# Patient Record
Sex: Female | Born: 1980 | Race: White | Hispanic: Yes | Marital: Married | Smoking: Never smoker
Health system: Southern US, Community
[De-identification: ages and names within clinical notes are randomized; demographics above are authoritative.]

## PROBLEM LIST (undated history)

## (undated) ENCOUNTER — Emergency Department (HOSPITAL_COMMUNITY): Payer: MEDICAID

## (undated) DIAGNOSIS — Z789 Other specified health status: Secondary | ICD-10-CM

## (undated) HISTORY — DX: Other specified health status: Z78.9

## (undated) HISTORY — PX: NO PAST SURGERIES: SHX2092

---

## 2002-01-09 ENCOUNTER — Inpatient Hospital Stay (HOSPITAL_COMMUNITY): Admission: AD | Admit: 2002-01-09 | Discharge: 2002-01-11 | Payer: Self-pay | Admitting: *Deleted

## 2002-01-09 ENCOUNTER — Encounter (INDEPENDENT_AMBULATORY_CARE_PROVIDER_SITE_OTHER): Payer: Self-pay | Admitting: Specialist

## 2002-03-18 ENCOUNTER — Encounter: Admission: RE | Admit: 2002-03-18 | Discharge: 2002-03-18 | Payer: Self-pay | Admitting: Obstetrics and Gynecology

## 2006-01-06 ENCOUNTER — Inpatient Hospital Stay (HOSPITAL_COMMUNITY): Admission: AD | Admit: 2006-01-06 | Discharge: 2006-01-08 | Payer: Self-pay | Admitting: Obstetrics

## 2009-12-25 ENCOUNTER — Inpatient Hospital Stay (HOSPITAL_COMMUNITY): Admission: AD | Admit: 2009-12-25 | Discharge: 2009-12-27 | Payer: Self-pay | Admitting: Obstetrics

## 2011-01-16 ENCOUNTER — Ambulatory Visit (HOSPITAL_COMMUNITY)
Admission: RE | Admit: 2011-01-16 | Discharge: 2011-01-16 | Disposition: A | Discharge status: Home / Self Care | Payer: Self-pay | Source: Ambulatory Visit | Attending: Obstetrics | Admitting: Obstetrics

## 2011-01-16 ENCOUNTER — Other Ambulatory Visit (HOSPITAL_COMMUNITY): Payer: Self-pay | Admitting: Obstetrics

## 2011-01-16 ENCOUNTER — Encounter (HOSPITAL_COMMUNITY): Payer: Self-pay

## 2011-01-16 DIAGNOSIS — Z3689 Encounter for other specified antenatal screening: Secondary | ICD-10-CM | POA: Insufficient documentation

## 2011-01-16 DIAGNOSIS — IMO0002 Reserved for concepts with insufficient information to code with codable children: Secondary | ICD-10-CM

## 2011-01-16 DIAGNOSIS — O368390 Maternal care for abnormalities of the fetal heart rate or rhythm, unspecified trimester, not applicable or unspecified: Secondary | ICD-10-CM | POA: Insufficient documentation

## 2011-03-02 LAB — CBC
Hemoglobin: 12.6 g/dL (ref 12.0–15.0)
MCHC: 33 g/dL (ref 30.0–36.0)
MCV: 87.1 fL (ref 78.0–100.0)
MCV: 87.3 fL (ref 78.0–100.0)
Platelets: 187 10*3/uL (ref 150–400)
RBC: 3.89 MIL/uL (ref 3.87–5.11)
RBC: 4.39 MIL/uL (ref 3.87–5.11)
WBC: 10.4 10*3/uL (ref 4.0–10.5)
WBC: 14.6 10*3/uL — ABNORMAL HIGH (ref 4.0–10.5)

## 2011-03-02 LAB — RPR: RPR Ser Ql: NONREACTIVE

## 2011-05-09 ENCOUNTER — Inpatient Hospital Stay (HOSPITAL_COMMUNITY)
Admission: AD | Admit: 2011-05-09 | Discharge: 2011-05-12 | DRG: 775 | Disposition: A | Discharge status: Home / Self Care | Payer: Medicaid Other | Source: Ambulatory Visit | Attending: Obstetrics | Admitting: Obstetrics

## 2011-05-10 ENCOUNTER — Other Ambulatory Visit: Payer: Self-pay | Admitting: Obstetrics & Gynecology

## 2011-05-11 LAB — CBC
HCT: 36.1 % (ref 36.0–46.0)
Hemoglobin: 11.6 g/dL — ABNORMAL LOW (ref 12.0–15.0)
MCV: 87.6 fL (ref 78.0–100.0)
RBC: 4.12 MIL/uL (ref 3.87–5.11)
RDW: 14.8 % (ref 11.5–15.5)
WBC: 11.1 10*3/uL — ABNORMAL HIGH (ref 4.0–10.5)

## 2012-03-21 IMAGING — US US OB LIMITED
1 series · 14 of 14 positions shown · non-contrast
Comparison: none

[Series 1: us ob limited · 14 of 14 slices shown]
[im 1/14]
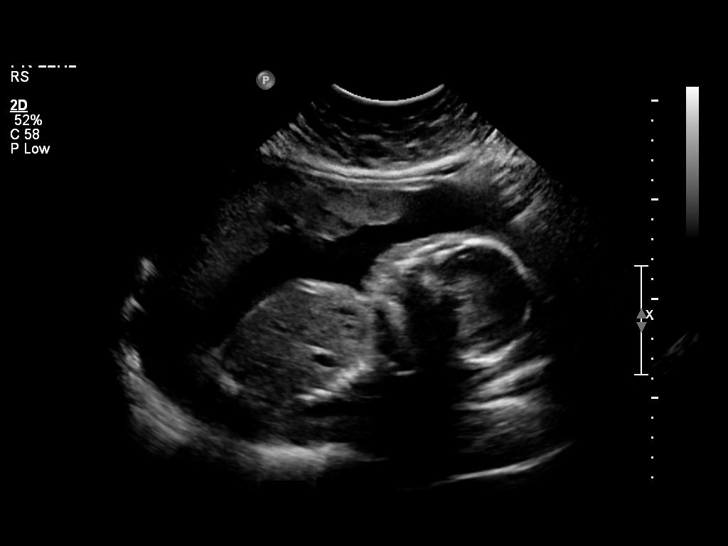
[im 2/14]
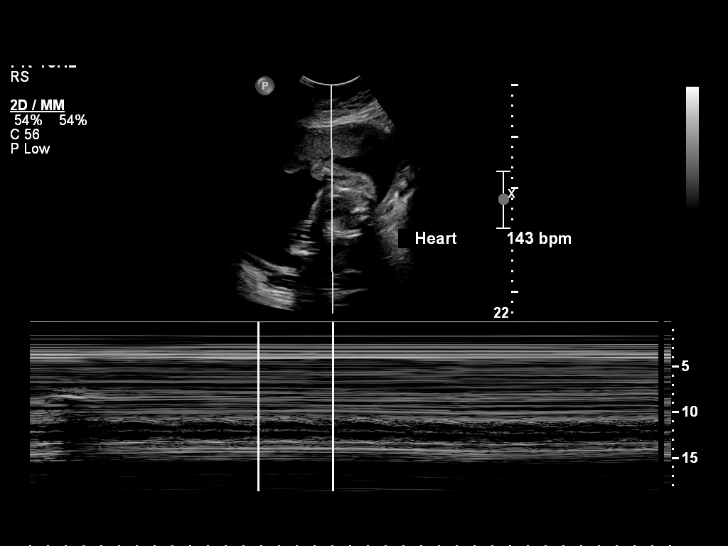
[im 3/14]
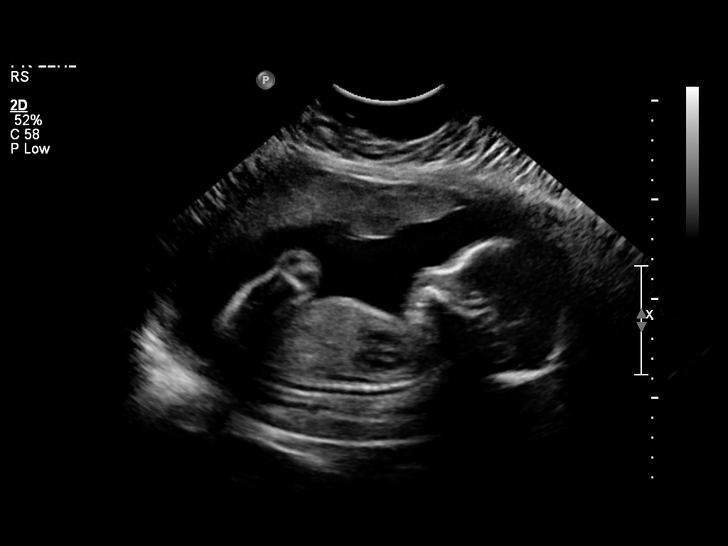
[im 4/14]
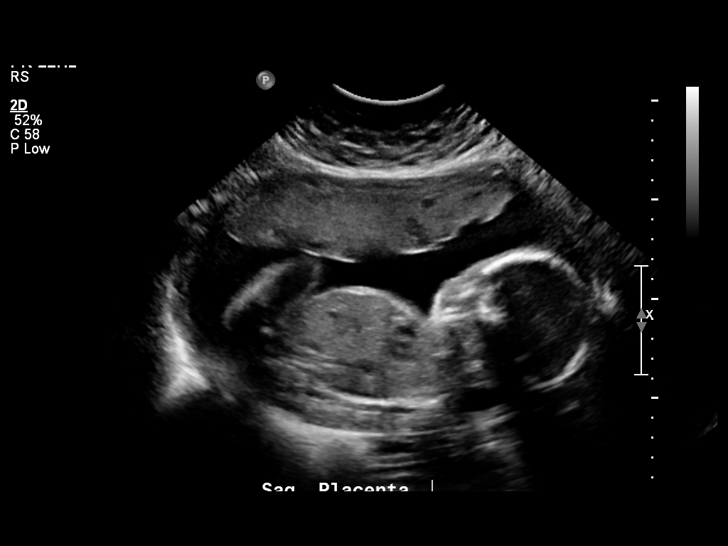
[im 5/14]
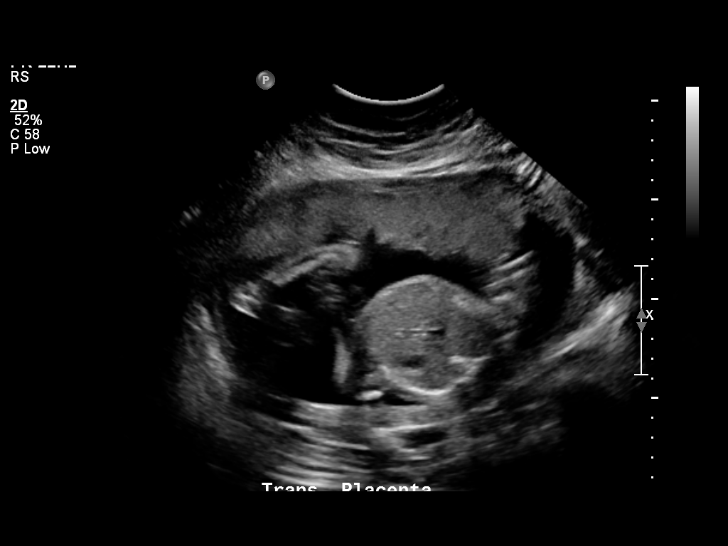
[im 6/14]
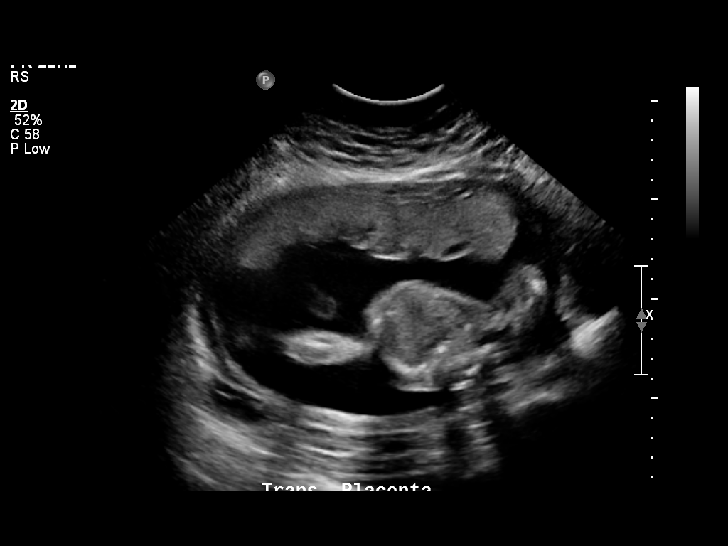
[im 7/14]
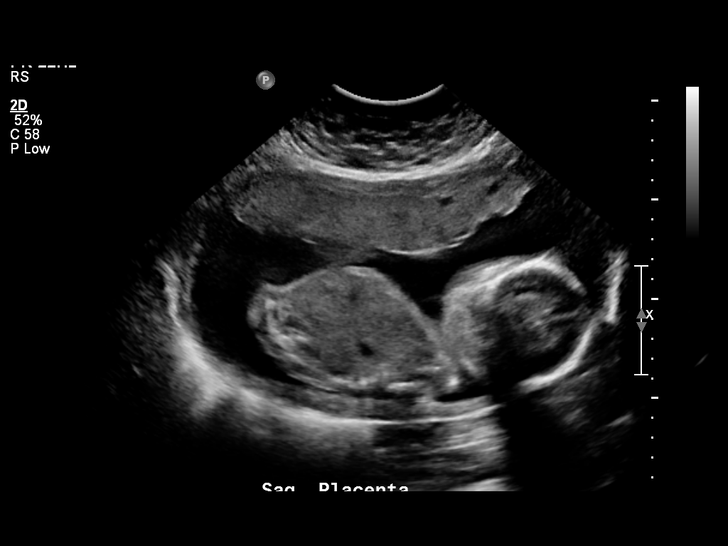
[im 8/14]
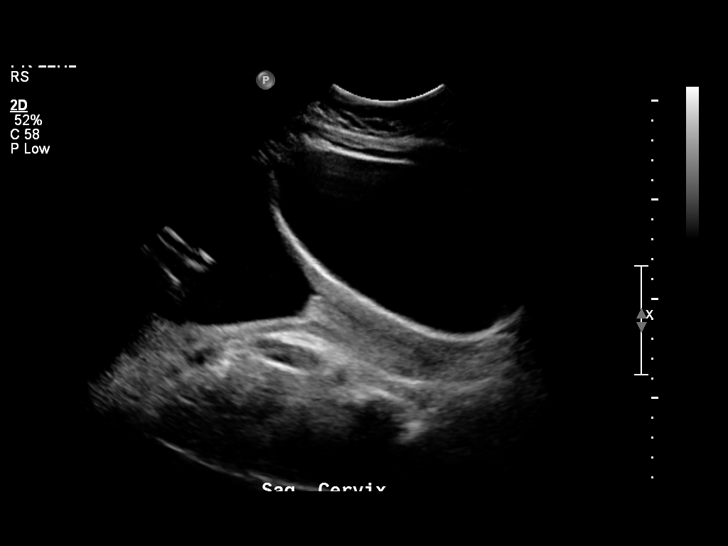
[im 9/14]
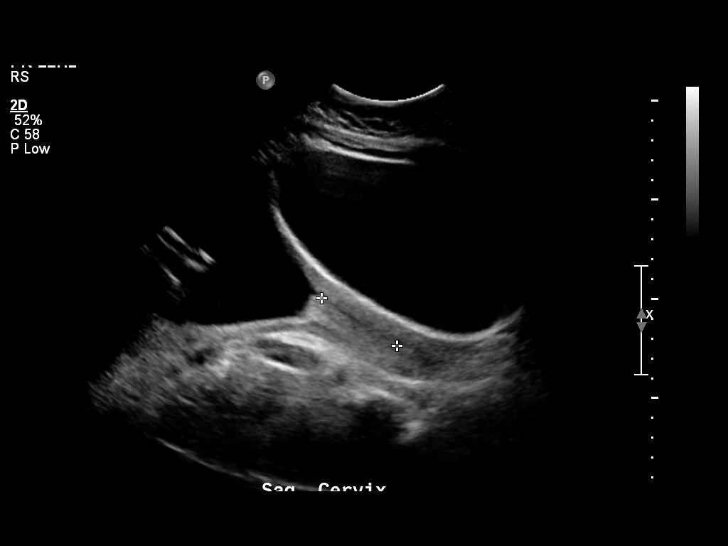
[im 10/14]
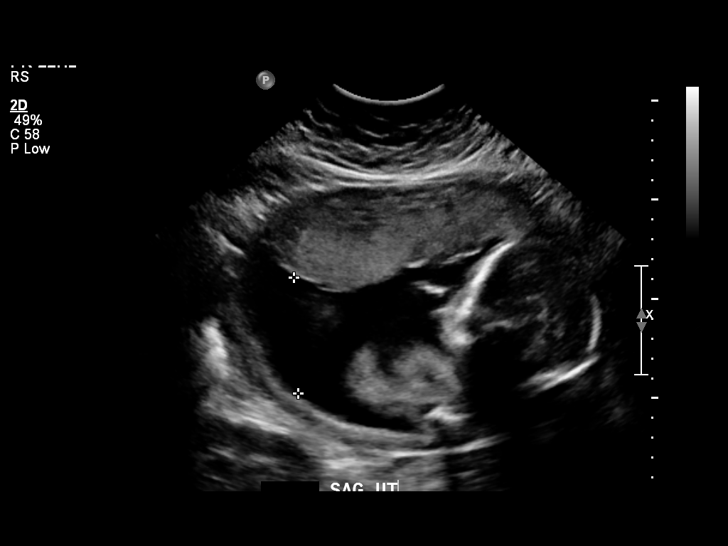
[im 11/14]
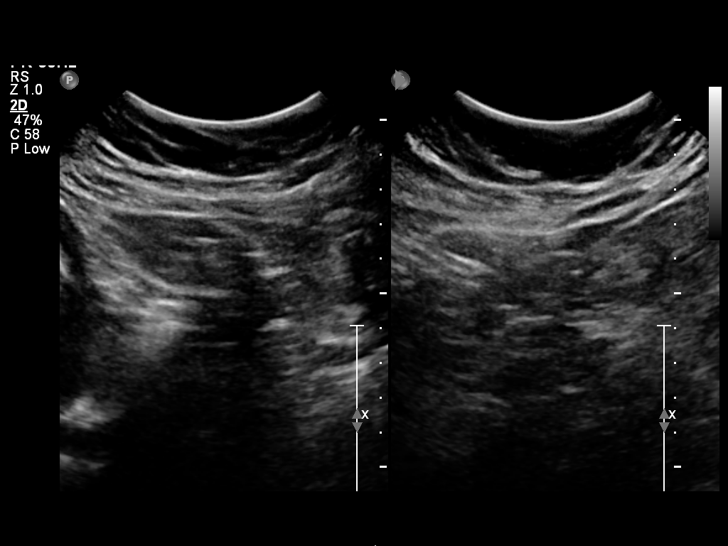
[im 12/14]
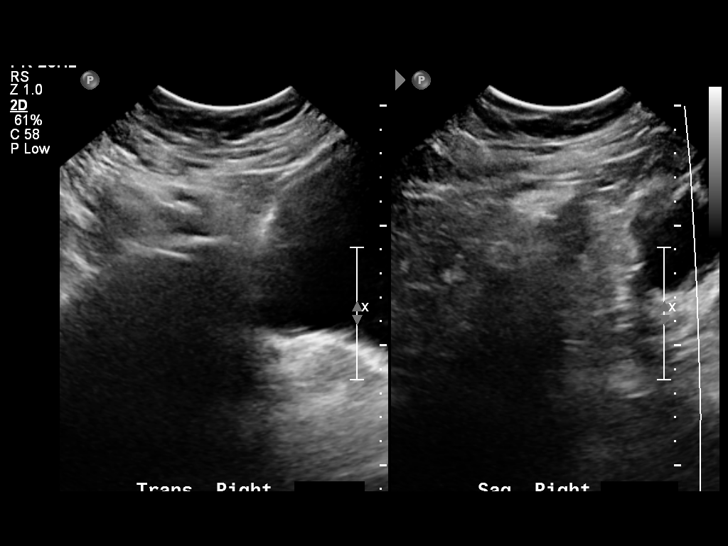
[im 13/14]
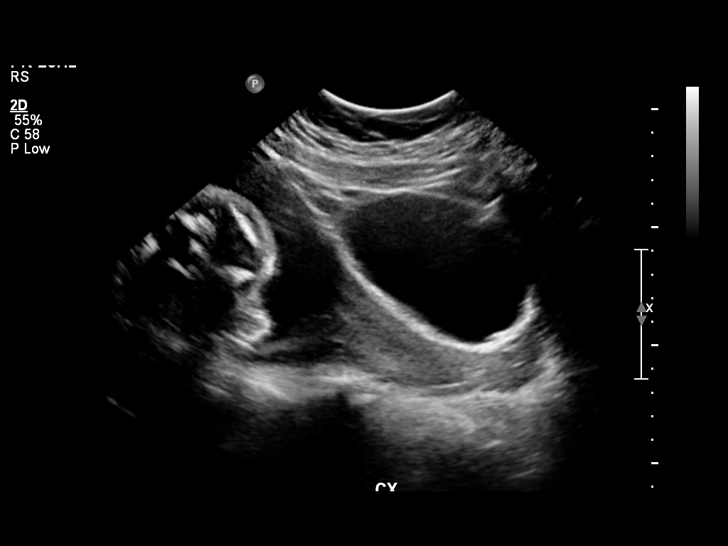
[im 14/14]
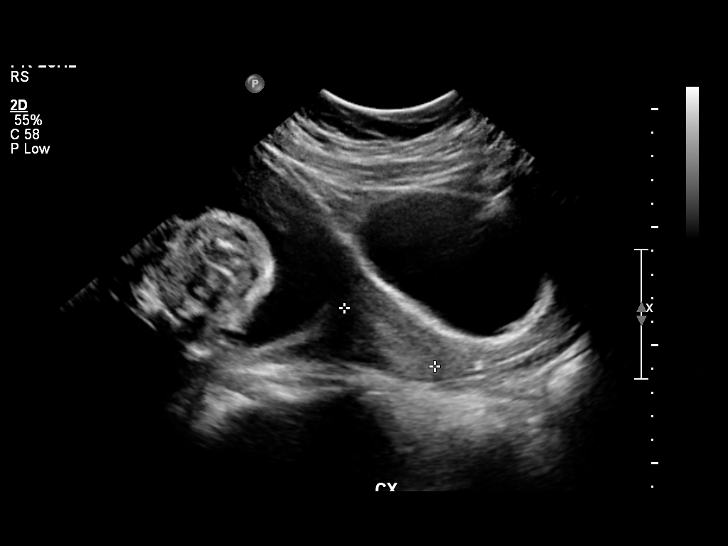

[14 of 14 positions shown; findings below may reference images not displayed]

OBSTETRICS REPORT
                      (Signed Final 01/16/2011 [DATE])

 Order#:         [id number]
Procedures

 [HOSPITAL]                                         76815.0
Indications

 No fetal cardiac activity detected;  assess viability
Fetal Evaluation

 Fetal Heart Rate:  143                          bpm
 Cardiac Activity:  Observed
 Presentation:      Cephalic
 Placenta:          Anterior, above cervical os
 P. Cord            Not well visualized
 Insertion:

 Amniotic Fluid
 AFI FV:      Subjectively within normal limits
                                             Larg Pckt:     5.8  cm
Cervix Uterus Adnexa

 Cervical Length:    4.47     cm

 Cervix:       Closed.

 Adnexa:     No abnormality visualized.
Impression

 Fetal cardiac activity confirmed with a rate of 143 bpm.

 Subjectively and quantitatively normal amniotic fluid volume.

 Normal cervical length and appearance, evaluated
 transabdominally.

 questions or concerns.

## 2014-10-16 ENCOUNTER — Encounter (HOSPITAL_COMMUNITY): Payer: Self-pay

## 2018-07-09 ENCOUNTER — Ambulatory Visit: Payer: Self-pay | Admitting: Emergency Medicine

## 2018-12-03 ENCOUNTER — Encounter (HOSPITAL_COMMUNITY): Payer: Self-pay | Admitting: Emergency Medicine

## 2018-12-03 ENCOUNTER — Ambulatory Visit (HOSPITAL_COMMUNITY)
Admission: EM | Admit: 2018-12-03 | Discharge: 2018-12-03 | Disposition: A | Discharge status: Home / Self Care | Payer: Self-pay | Attending: Internal Medicine | Admitting: Internal Medicine

## 2018-12-03 ENCOUNTER — Ambulatory Visit (HOSPITAL_COMMUNITY): Payer: Self-pay

## 2018-12-03 DIAGNOSIS — R1012 Left upper quadrant pain: Secondary | ICD-10-CM

## 2018-12-03 DIAGNOSIS — K5900 Constipation, unspecified: Secondary | ICD-10-CM

## 2018-12-03 DIAGNOSIS — K5901 Slow transit constipation: Secondary | ICD-10-CM

## 2018-12-03 DIAGNOSIS — R109 Unspecified abdominal pain: Secondary | ICD-10-CM

## 2018-12-03 DIAGNOSIS — Z975 Presence of (intrauterine) contraceptive device: Secondary | ICD-10-CM

## 2018-12-03 LAB — POCT I-STAT, CHEM 8
BUN: 11 mg/dL (ref 6–20)
CALCIUM ION: 1.18 mmol/L (ref 1.15–1.40)
CHLORIDE: 102 mmol/L (ref 98–111)
Creatinine, Ser: 0.6 mg/dL (ref 0.44–1.00)
GLUCOSE: 101 mg/dL — AB (ref 70–99)
HCT: 45 % (ref 36.0–46.0)
Hemoglobin: 15.3 g/dL — ABNORMAL HIGH (ref 12.0–15.0)
POTASSIUM: 4.5 mmol/L (ref 3.5–5.1)
Sodium: 139 mmol/L (ref 135–145)
TCO2: 31 mmol/L (ref 22–32)

## 2018-12-03 LAB — POCT PREGNANCY, URINE: PREG TEST UR: NEGATIVE

## 2018-12-03 MED ORDER — DICYCLOMINE HCL 20 MG PO TABS: 20.0000 mg | ORAL_TABLET | Freq: Two times a day (BID) | ORAL | 0 refills | Status: DC | PRN

## 2018-12-03 MED ORDER — POLYETHYLENE GLYCOL 3350 17 GM/SCOOP PO POWD: 17.0000 g | Freq: Every day | ORAL | 0 refills | Status: DC

## 2018-12-03 NOTE — Discharge Instructions (Signed)
Recommend start Miralax- dissolve powder in a liquid once a day. May take Bentyl 1 tablet every 12 hours as needed for stomach pain. Continue to increase fluids in diet. Follow-up with your PCP (listed above) as soon as possible for recheck or go to the Emergency Room if pain does not improve.

## 2018-12-03 NOTE — ED Provider Notes (Signed)
[place]    CSN: [id number] Arrival date & time: 12/03/18  2585     History   Chief Complaint Chief Complaint  Patient presents with  . Abdominal Pain    HPI Regina Cunningham is a 37 y.o. female.   37 year old female presents with left upper abdominal pain that radiates up towards her chest for the past 4 to 5 months. Is intermittent but 5 days ago had severe pain which caused vomiting. Also having occasional chills but denies any distinct fever, URI symptoms, cough, lower abdominal pain, dysuria, unusual vaginal discharge or back pain. She also denies any nausea or diarrhea. She has regular bowel movements almost daily and had a small bowel movement this morning. Eating food or drinking does not seem to affect the pain. Has tried taking Ibuprofen with minimal relief. She feels like there is a "knot" in that area and uncertain of etiology. No other chronic health issues. Takes no daily medication.   The history is provided by the patient. The history is limited by a language barrier. A language interpreter was used (Spanish video interpreter used).    History reviewed. No pertinent past medical history.  There are no active problems to display for this patient.   History reviewed. No pertinent surgical history.  OB History    Gravida  1   Para      Term      Preterm      AB      Living        SAB      TAB      Ectopic      Multiple      Live Births               Home Medications    Prior to Admission medications   Medication Sig Start Date End Date Taking? Authorizing Provider  levonorgestrel (MIRENA) 20 MCG/24HR IUD 1 each by Intrauterine route once.   Yes [provider]  dicyclomine (BENTYL) 20 MG tablet Take 1 tablet (20 mg total) by mouth 2 (two) times daily as needed (stomach pain and spasms). 12/03/18   Katy Apo, NP  polyethylene glycol powder (GLYCOLAX/MIRALAX) powder Take 17 g by mouth daily. 12/03/18   Katy Apo, NP    Family History History reviewed. No pertinent family history.  Social History Social History   Tobacco Use  . Smoking status: Never Smoker  . Smokeless tobacco: Never Used  Substance Use Topics  . Alcohol use: Never    Frequency: Never  . Drug use: Never     Allergies   Patient has no known allergies.   Review of Systems Review of Systems  Constitutional: Positive for chills. Negative for activity change, appetite change, diaphoresis, fatigue and fever.  HENT: Negative for congestion, facial swelling, sore throat and trouble swallowing.   Respiratory: Negative for cough, chest tightness, shortness of breath and wheezing.   Cardiovascular: Negative for chest pain and palpitations.  Gastrointestinal: Positive for abdominal pain and vomiting. Negative for blood in stool, constipation, diarrhea and nausea.  Genitourinary: Negative for decreased urine volume, difficulty urinating, dysuria, flank pain, frequency, hematuria, pelvic pain, vaginal bleeding and vaginal discharge.  Musculoskeletal: Negative for arthralgias, back pain and myalgias.  Skin: Negative for color change, rash and wound.  Neurological: Negative for dizziness, tremors, seizures, syncope, weakness, light-headedness, numbness and headaches.  Hematological: Negative for adenopathy. Does not bruise/bleed easily.     Physical Exam Triage Vital  Signs ED Triage Vitals [12/03/18 1022]  Enc Vitals Group     BP 138/81     Pulse Rate 74     Resp 18     Temp 98 F (36.7 C)     Temp Source Oral     SpO2 98 %     Weight      Height      Head Circumference      Peak Flow      Pain Score 6     Pain Loc      Pain Edu?      Excl. in [place]?    No data found.  Updated Vital Signs BP 138/81 (BP Location: Left Arm)   Pulse 74   Temp 98 F (36.7 C) (Oral)   Resp 18   SpO2 98%   Breastfeeding Unknown Comment: tested negative for pregnancy  Visual Acuity Right Eye Distance:   Left Eye Distance:     Bilateral Distance:    Right Eye Near:   Left Eye Near:    Bilateral Near:     Physical Exam Vitals signs and nursing note reviewed.  Constitutional:      General: She is not in acute distress.    Appearance: Normal appearance. She is well-developed, well-groomed and overweight. She is not ill-appearing.     Comments: Patient sitting comfortably in exam chair in no acute distress. Patient denies pain currently.   HENT:     Head: Normocephalic and atraumatic.     Right Ear: Hearing, tympanic membrane, ear canal and external ear normal.     Left Ear: Hearing, tympanic membrane, ear canal and external ear normal.     Nose: Nose normal.     Mouth/Throat:     Lips: Pink.     Mouth: Mucous membranes are moist.     Pharynx: Oropharynx is clear.  Eyes:     Extraocular Movements: Extraocular movements intact.     Conjunctiva/sclera: Conjunctivae normal.  Neck:     Musculoskeletal: Normal range of motion.  Cardiovascular:     Rate and Rhythm: Normal rate and regular rhythm.     Heart sounds: Normal heart sounds. No murmur.  Pulmonary:     Effort: Pulmonary effort is normal. No respiratory distress.     Breath sounds: Normal breath sounds and air entry. No decreased breath sounds, wheezing, rhonchi or rales.  Abdominal:     General: Bowel sounds are normal. There is no distension.     Palpations: Abdomen is soft. There is no hepatomegaly, splenomegaly, mass or pulsatile mass.     Tenderness: There is abdominal tenderness in the left upper quadrant. There is no right CVA tenderness, left CVA tenderness, guarding or rebound.       Comments: Tender on left upper quadrant area just below ribcage. No distinct mass detected. No splenomegaly.   Musculoskeletal: Normal range of motion.  Skin:    General: Skin is warm and dry.     Capillary Refill: Capillary refill takes less than 2 seconds.     Coloration: Skin is not jaundiced.     Findings: No erythema or rash.  Neurological:      Mental Status: She is alert and oriented to person, place, and time.     Sensory: Sensation is intact.     Motor: Motor function is intact.  Psychiatric:        Mood and Affect: Mood normal.        Behavior: Behavior normal. Behavior is  cooperative.      UC Treatments / Results  Labs (all labs ordered are listed, but only abnormal results are displayed) Labs Reviewed  POCT I-STAT, CHEM 8 - Abnormal; Notable for the following components:      Result Value   Glucose, Bld 101 (*)    Hemoglobin 15.3 (*)    All other components within normal limits  POC URINE PREG, ED  I-STAT CHEM 8, ED  POCT PREGNANCY, URINE    EKG None  Radiology Dg Abd 1 View  Result Date: 12/03/2018 CLINICAL DATA:  Upper abdominal pain, primarily left-sided EXAM: ABDOMEN - 1 VIEW COMPARISON:  None. FINDINGS: There is moderate stool in the colon. There is no bowel dilatation or air-fluid level to suggest bowel obstruction. No free air. Intrauterine device positioned in mid pelvis. IMPRESSION: No bowel obstruction or free air. Moderate stool in colon. Intrauterine device in mid pelvis. Electronically Signed   By: Lowella Grip III M.D.   On: 12/03/2018 11:21    Procedures Procedures (including critical care time)  Medications Ordered in UC Medications - No data to display  Initial Impression / Assessment and Plan / UC Course  I have reviewed the triage vital signs and the nursing notes.  Pertinent labs & imaging results that were available during my care of the patient were reviewed by me and considered in my medical decision making (see chart for details).    Reviewed negative urine pregnancy test with patient. Reviewed I-stat results which are essentially negative- just slightly elevated glucose. Discussed abdominal x-ray findings showing moderate amount of stool in colon. Discussed that she may have some slow transit constipation that could be causing her pain. Recommend trial Miralax once daily. May  also take Bentyl every 12 hours as needed for pain or spasms. Increase fluids in diet. Discussed that she may need additional testing and/or imaging to determine cause of pain. She will need to see a PCP for further work-up or go to the ER if pain becomes more severe. Patient verbalizes understanding and will make an appointment with a PCP ASAP for recheck.  Final Clinical Impressions(s) / UC Diagnoses   Final diagnoses:  Abdominal pain, left upper quadrant  Slow transit constipation     Discharge Instructions     Recommend start Miralax- dissolve powder in a liquid once a day. May take Bentyl 1 tablet every 12 hours as needed for stomach pain. Continue to increase fluids in diet. Follow-up with your PCP (listed above) as soon as possible for recheck or go to the Emergency Room if pain does not improve.     ED Prescriptions    Medication Sig Dispense Auth. Provider   polyethylene glycol powder (GLYCOLAX/MIRALAX) powder Take 17 g by mouth daily. 225 g Katy Apo, NP   dicyclomine (BENTYL) 20 MG tablet Take 1 tablet (20 mg total) by mouth 2 (two) times daily as needed (stomach pain and spasms). 20 tablet Katy Apo, NP     Controlled Substance Prescriptions [place] Controlled Substance Registry consulted? Not Applicable   Katy Apo, NP 12/03/18 2134

## 2018-12-03 NOTE — ED Triage Notes (Signed)
Pt here for upper abd pain x 3 days with vomiting on Sunday but not since

## 2018-12-03 NOTE — ED Notes (Signed)
Patient able to ambulate independently  

## 2018-12-30 ENCOUNTER — Ambulatory Visit: Payer: Self-pay | Admitting: Emergency Medicine

## 2019-10-13 ENCOUNTER — Ambulatory Visit (INDEPENDENT_AMBULATORY_CARE_PROVIDER_SITE_OTHER): Payer: Self-pay | Admitting: Emergency Medicine

## 2019-10-13 ENCOUNTER — Encounter: Payer: Self-pay | Admitting: Emergency Medicine

## 2019-10-13 ENCOUNTER — Other Ambulatory Visit: Payer: Self-pay

## 2019-10-13 VITALS — BP 116/79 | HR 85 | Temp 99.5°F | Resp 16 | Wt 219.4 lb

## 2019-10-13 DIAGNOSIS — R1012 Left upper quadrant pain: Secondary | ICD-10-CM

## 2019-10-13 DIAGNOSIS — D171 Benign lipomatous neoplasm of skin and subcutaneous tissue of trunk: Secondary | ICD-10-CM

## 2019-10-13 NOTE — Patient Instructions (Addendum)
If you have lab work done today you will be contacted with your lab results within the next 2 weeks.  If you have not heard from [place] then please contact us. The fastest way to get your results is to register for My Chart.   IF you received an x-ray today, you will receive an invoice from [facility]. Please contact [facility] Radiology at [phone] with questions or concerns regarding your invoice.   IF you received labwork today, you will receive an invoice from [facility]. Please contact [facility] at [phone] with questions or concerns regarding your invoice.   Our billing staff will not be able to assist you with questions regarding bills from these companies.  You will be contacted with the lab results as soon as they are available. The fastest way to get your results is to activate your My Chart account. Instructions are located on the last page of this paperwork. If you have not heard from [place] regarding the results in 2 weeks, please contact this office.     Lipoma Lipoma  Un lipoma es un tumor no canceroso (benigno) formado por clulas de grasa. Es un tipo muy frecuente de [employer] tejidos blandos. Por lo general, los lipomas se encuentran debajo de la piel (subcutneos). Pueden aparecer en cualquier tejido del cuerpo que contenga grasa. Las [employer] los lipomas aparecen con mayor frecuencia incluyen la espalda, los hombros, las nalgas y los muslos.  Los lipomas crecen lentamente y, en general, son indoloros. La mayora de los lipomas no causan problemas y no requieren Clinical research associate. Cules son las causas? Se desconoce la causa de esta afeccin. Qu incrementa el riesgo? Es ms probable que usted sufra esta afeccin si:  Regina Cunningham 63 y 24aos de edad.  Tiene antecedentes familiares de lipomas. Cules son los signos o los sntomas? Por lo general, el lipoma aparece como una pequea protuberancia redonda debajo de la piel. En la [employer], el bulto tiene las siguientes caractersticas:  Se siente suave o elstico.  No causan dolor ni otros sntomas. Sin embargo, si el lipoma se encuentra en un rea en la que hace presin [employer] nervios, puede causar dolor u otros sntomas. Cmo se diagnostica? Por lo general, el lipoma puede diagnosticarse con un examen fsico. Tambin pueden hacerle estudios para confirmar el diagnstico y Designer, industrial/product. Los estudios pueden incluir lo siguiente:  Pruebas de diagnstico por imgenes, como una resonancia magntica (RM) o una exploracin por tomografa computarizada (TC).  Extraccin de [place] de tejido para analizar con un microscopio (biopsia). Cmo se trata? El tratamiento de esta afeccin depende del tamao del lipoma y si causa algn sntoma.  Los lipomas pequeos que no causan problemas no requieren tratamiento.  Si un lipoma se agranda o causa problemas, puede realizarse [place] para extirpar el lipoma. Los lipomas tambin pueden extirparse para mejorar el aspecto. En la [employer], [place] procedimiento se realiza despus de aplicar un medicamento que adormece el rea (anestesia local). Siga estas indicaciones en su casa:  Controle el lipoma para detectar cambios.  Concurra a todas las visitas de control como se lo haya indicado el mdico. Esto es importante. Comunquese con un mdico si:  El lipoma se agranda o se endurece.  El lipoma comienza a causarle dolor, se enrojece o se hincha cada vez ms. Estos podran ser signos de infeccin o de una afeccin ms grave. Solicite ayuda de inmediato si:  Siente hormigueo o adormecimiento en el rea cerca del lipoma. Esto podra indicar que el lipoma es lo que causa dao nervioso. Resumen  Un lipoma es un tumor no canceroso formado por clulas de grasa.  La mayora de los lipomas no causan problemas y no requieren Clinical research associate.  Si un lipoma se agranda o causa problemas, puede realizarse [place] para extirpar el lipoma. Esta informacin no tiene Marine scientist el consejo del mdico. Asegrese de hacerle al mdico cualquier pregunta que tenga. Document Released: 09/10/2005 Document Revised: 01/25/2018 Document Reviewed: 01/25/2018 Elsevier Patient Education  2020 [employer].

## 2019-10-13 NOTE — Progress Notes (Signed)
Regina Cunningham 38 y.o.   Chief Complaint  Patient presents with  . Back Pain    for 1 week - a bump    HISTORY OF PRESENT ILLNESS: This is a 38 y.o. female complaining of lump to left upper quadrant abdomen for the past 8 to 9 months with occasional tenderness and no other associated symptoms.  Healthy female with no significant past medical history.  HPI   Prior to Admission medications   Medication Sig Start Date End Date Taking? Authorizing Provider  levonorgestrel (MIRENA) 20 MCG/24HR IUD 1 each by Intrauterine route once.   Yes [provider]  dicyclomine (BENTYL) 20 MG tablet Take 1 tablet (20 mg total) by mouth 2 (two) times daily as needed (stomach pain and spasms). Patient not taking: Reported on 10/13/2019 12/03/18   Katy Apo, NP  polyethylene glycol powder (GLYCOLAX/MIRALAX) powder Take 17 g by mouth daily. Patient not taking: Reported on 10/13/2019 12/03/18   Katy Apo, NP    No Known Allergies  There are no active problems to display for this patient.   History reviewed. No pertinent past medical history.  History reviewed. No pertinent surgical history.  Social History   Socioeconomic History  . Marital status: Married    Spouse name: Not on file  . Number of children: Not on file  . Years of education: Not on file  . Highest education level: Not on file  Occupational History  . Not on file  Social Needs  . Financial resource strain: Not on file  . Food insecurity    Worry: Not on file    Inability: Not on file  . Transportation needs    Medical: Not on file    Non-medical: Not on file  Tobacco Use  . Smoking status: Never Smoker  . Smokeless tobacco: Never Used  Substance and Sexual Activity  . Alcohol use: Never    Frequency: Never  . Drug use: Never  . Sexual activity: Not on file  Lifestyle  . Physical activity    Days per week: Not on file    Minutes per session: Not on file  . Stress: Not on file   Relationships  . Social Herbalist on phone: Not on file    Gets together: Not on file    Attends religious service: Not on file    Active member of club or organization: Not on file    Attends meetings of clubs or organizations: Not on file    Relationship status: Not on file  . Intimate partner violence    Fear of current or ex partner: Not on file    Emotionally abused: Not on file    Physically abused: Not on file    Forced sexual activity: Not on file  Other Topics Concern  . Not on file  Social History Narrative  . Not on file    History reviewed. No pertinent family history.   Review of Systems  Constitutional: Negative.  Negative for chills and fever.  Respiratory: Negative for cough and shortness of breath.   Cardiovascular: Negative for chest pain and palpitations.  Gastrointestinal: Negative for abdominal pain, blood in stool, nausea and vomiting.  Genitourinary: Negative for dysuria, frequency and hematuria.  Skin: Negative.  Negative for rash.  Neurological: Negative for dizziness and headaches.  All other systems reviewed and are negative.  Today's Vitals   10/13/19 1342  BP: 116/79  Pulse: 85  Resp: 16  Temp: 99.5  F (37.5 C)  TempSrc: Oral  SpO2: 96%  Weight: 219 lb 6.4 oz (99.5 kg)   There is no height or weight on file to calculate BMI.   Physical Exam Vitals signs reviewed.  Constitutional:      Appearance: Normal appearance.  HENT:     Head: Normocephalic.  Eyes:     Extraocular Movements: Extraocular movements intact.     Pupils: Pupils are equal, round, and reactive to light.  Neck:     Musculoskeletal: Normal range of motion.  Cardiovascular:     Rate and Rhythm: Normal rate and regular rhythm.     Heart sounds: Normal heart sounds.  Pulmonary:     Effort: Pulmonary effort is normal.     Breath sounds: Normal breath sounds.  Abdominal:     General: Bowel sounds are normal. There is no distension.     Palpations:  Abdomen is soft.     Tenderness: There is no abdominal tenderness.     Comments: Positive nontender lipoma to left upper quadrant area  Musculoskeletal: Normal range of motion.  Skin:    General: Skin is warm and dry.     Capillary Refill: Capillary refill takes less than 2 seconds.  Neurological:     General: No focal deficit present.     Mental Status: She is alert and oriented to person, place, and time.  Psychiatric:        Mood and Affect: Mood normal.        Behavior: Behavior normal.      ASSESSMENT & PLAN: Leddy was seen today for back pain.  Diagnoses and all orders for this visit:  Lipoma of abdominal wall  Left upper quadrant pain -     Cancel: POCT urinalysis dipstick -     Cancel: POCT urine pregnancy    Patient Instructions       If you have lab work done today you will be contacted with your lab results within the next 2 weeks.  If you have not heard from [place] then please contact us. The fastest way to get your results is to register for My Chart.   IF you received an x-ray today, you will receive an invoice from [facility] Radiology. Please contact [facility]iology at [phone] with questions or concerns regarding your invoice.   IF you received labwork today, you will receive an invoice from [facility]. Please contact [facility] at [phone] with questions or concerns regarding your invoice.   Our billing staff will not be able to assist you with questions regarding bills from these companies.  You will be contacted with the lab results as soon as they are available. The fastest way to get your results is to activate your My Chart account. Instructions are located on the last page of this paperwork. If you have not heard from [place] regarding the results in 2 weeks, please contact this office.     Lipoma Lipoma  Un lipoma es un tumor no canceroso (benigno) formado por clulas de grasa. Es un tipo muy frecuente de [employer] tejidos blandos.  Por lo general, los lipomas se encuentran debajo de la piel (subcutneos). Pueden aparecer en cualquier tejido del cuerpo que contenga grasa. Las [employer] los lipomas aparecen con mayor frecuencia incluyen la espalda, los hombros, las nalgas y los muslos.  Los lipomas crecen lentamente y, en general, son indoloros. La mayora de los lipomas no causan problemas y no requieren Clinical research associate. Cules son las causas? Se  desconoce la causa de esta afeccin. Qu incrementa el riesgo? Es ms probable que usted sufra esta afeccin si:  Fidel Levy 63 y 71aos de edad.  Tiene antecedentes familiares de lipomas. Cules son los signos o los sntomas? Por lo general, el lipoma aparece como una pequea protuberancia redonda debajo de la piel. En la [employer], el bulto tiene las siguientes caractersticas:  Se siente suave o elstico.  No causan dolor ni otros sntomas. Sin embargo, si el lipoma se encuentra en un rea en la que hace presin [employer] nervios, puede causar dolor u otros sntomas. Cmo se diagnostica? Por lo general, el lipoma puede diagnosticarse con un examen fsico. Tambin pueden hacerle estudios para confirmar el diagnstico y Designer, industrial/product. Los estudios pueden incluir lo siguiente:  Pruebas de diagnstico por imgenes, como una resonancia magntica (RM) o una exploracin por tomografa computarizada (TC).  Extraccin de [place] de tejido para analizar con un microscopio (biopsia). Cmo se trata? El tratamiento de esta afeccin depende del tamao del lipoma y si causa algn sntoma.  Los lipomas pequeos que no causan problemas no requieren tratamiento.  Si un lipoma se agranda o causa problemas, puede realizarse [place] para extirpar el lipoma. Los lipomas tambin pueden extirparse para mejorar el aspecto. En la [employer], [place] procedimiento se realiza despus de aplicar un medicamento que adormece el rea (anestesia local). Siga  estas indicaciones en su casa:  Controle el lipoma para detectar cambios.  Concurra a todas las visitas de control como se lo haya indicado el mdico. Esto es importante. Comunquese con un mdico si:  El lipoma se agranda o se endurece.  El lipoma comienza a causarle dolor, se enrojece o se hincha cada vez ms. Estos podran ser signos de infeccin o de una afeccin ms grave. Solicite ayuda de inmediato si:  Siente hormigueo o adormecimiento en el rea cerca del lipoma. Esto podra indicar que el lipoma es lo que causa dao nervioso. Resumen  Un lipoma es un tumor no canceroso formado por clulas de grasa.  La mayora de los lipomas no causan problemas y no requieren Clinical research associate.  Si un lipoma se agranda o causa problemas, puede realizarse [place] para extirpar el lipoma. Esta informacin no tiene Marine scientist el consejo del mdico. Asegrese de hacerle al mdico cualquier pregunta que tenga. Document Released: 09/10/2005 Document Revised: 01/25/2018 Document Reviewed: 01/25/2018 Elsevier Patient Education  2020 Elsevier Inc.      Agustina Caroli, MD [facility]

## 2020-02-06 IMAGING — DX DG ABDOMEN 1V
1 series · 1 of 1 positions shown · non-contrast
Comparison: None.

CLINICAL DATA: Upper abdominal pain, primarily left-sided

EXAM:
ABDOMEN - 1 VIEW

[abdomen kub]
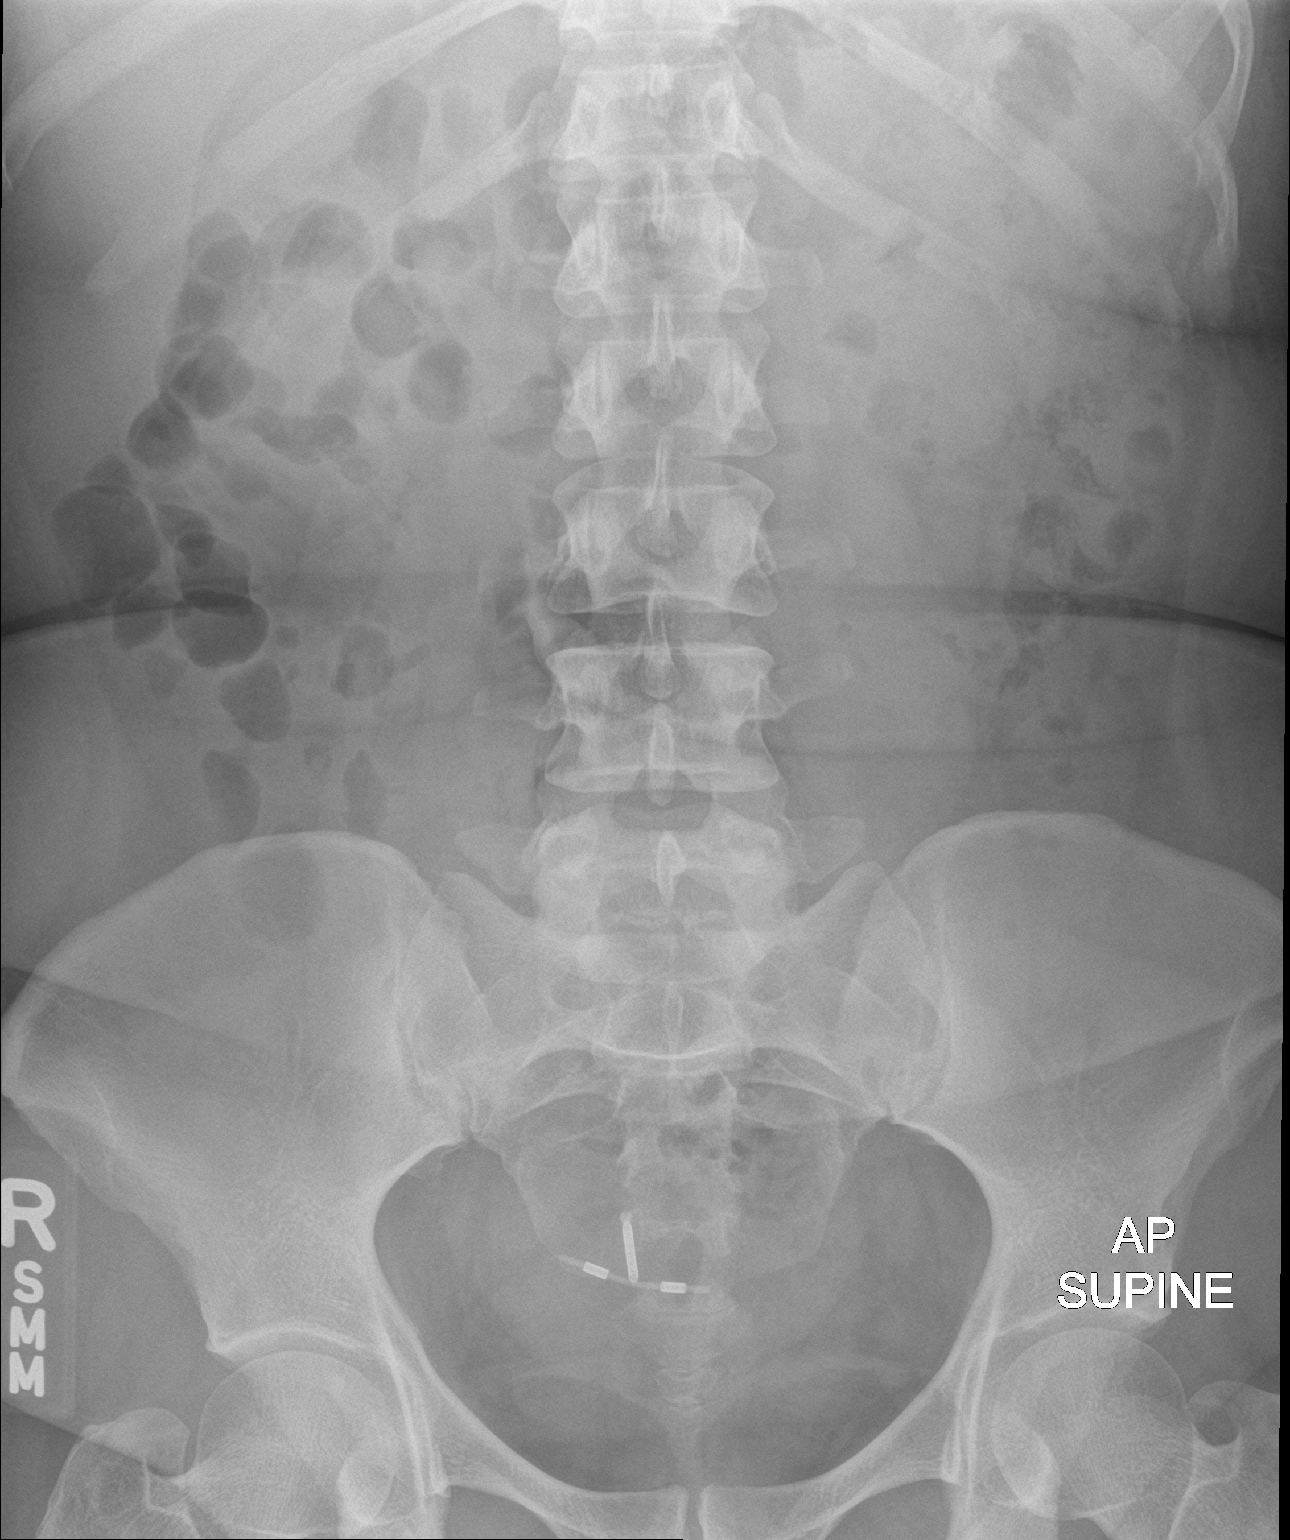

[1 of 1 positions shown; findings below may reference images not displayed]

FINDINGS: There is moderate stool in the colon. There is no bowel dilatation
or air-fluid level to suggest bowel obstruction. No free air.
Intrauterine device positioned in mid pelvis.
IMPRESSION: No bowel obstruction or free air. Moderate stool in colon.
Intrauterine device in mid pelvis.

## 2021-08-07 ENCOUNTER — Other Ambulatory Visit: Payer: Self-pay | Admitting: *Deleted

## 2021-08-07 DIAGNOSIS — Z3481 Encounter for supervision of other normal pregnancy, first trimester: Secondary | ICD-10-CM

## 2021-08-16 ENCOUNTER — Other Ambulatory Visit: Payer: Self-pay

## 2021-08-16 ENCOUNTER — Other Ambulatory Visit (INDEPENDENT_AMBULATORY_CARE_PROVIDER_SITE_OTHER): Payer: Self-pay

## 2021-08-16 DIAGNOSIS — Z3201 Encounter for pregnancy test, result positive: Secondary | ICD-10-CM

## 2021-08-16 DIAGNOSIS — Z3481 Encounter for supervision of other normal pregnancy, first trimester: Secondary | ICD-10-CM

## 2021-08-16 LAB — POCT URINALYSIS DIP (MANUAL ENTRY)
Bilirubin, UA: NEGATIVE
Blood, UA: NEGATIVE
Glucose, UA: NEGATIVE mg/dL
Ketones, POC UA: NEGATIVE mg/dL
Leukocytes, UA: NEGATIVE
Nitrite, UA: NEGATIVE
Protein Ur, POC: NEGATIVE mg/dL
Spec Grav, UA: 1.02 (ref 1.010–1.025)
Urobilinogen, UA: 0.2 E.U./dL
pH, UA: 7.5 (ref 5.0–8.0)

## 2021-08-16 LAB — POCT URINE PREGNANCY: Preg Test, Ur: POSITIVE — AB

## 2021-08-18 LAB — CULTURE, OB URINE

## 2021-08-18 LAB — URINE CULTURE, OB REFLEX

## 2021-08-19 LAB — OBSTETRIC PANEL, INCLUDING HIV
Antibody Screen: NEGATIVE
Basophils Absolute: 0.1 10*3/uL (ref 0.0–0.2)
Basos: 1 %
EOS (ABSOLUTE): 0.1 10*3/uL (ref 0.0–0.4)
Eos: 1 %
HIV Screen 4th Generation wRfx: NONREACTIVE
Hematocrit: 39.9 % (ref 34.0–46.6)
Hemoglobin: 13.1 g/dL (ref 11.1–15.9)
Hepatitis B Surface Ag: NEGATIVE
Immature Grans (Abs): 0.2 10*3/uL — ABNORMAL HIGH (ref 0.0–0.1)
Immature Granulocytes: 2 %
Lymphocytes Absolute: 1.9 10*3/uL (ref 0.7–3.1)
Lymphs: 18 %
MCH: 28.2 pg (ref 26.6–33.0)
MCHC: 32.8 g/dL (ref 31.5–35.7)
MCV: 86 fL (ref 79–97)
Monocytes Absolute: 0.7 10*3/uL (ref 0.1–0.9)
Monocytes: 6 %
Neutrophils Absolute: 7.4 10*3/uL — ABNORMAL HIGH (ref 1.4–7.0)
Neutrophils: 72 %
Platelets: 271 10*3/uL (ref 150–450)
RBC: 4.64 x10E6/uL (ref 3.77–5.28)
RDW: 14 % (ref 11.7–15.4)
RPR Ser Ql: NONREACTIVE
Rh Factor: POSITIVE
Rubella Antibodies, IGG: 9.97 index (ref >0.99)
WBC: 10.2 10*3/uL (ref 3.4–10.8)

## 2021-08-19 LAB — HGB FRACTIONATION CASCADE
Hgb A2: 3 % (ref 1.8–3.2)
Hgb A: 97 % (ref 96.4–98.8)
Hgb F: 0 % (ref 0.0–2.0)
Hgb S: 0 %

## 2021-08-19 LAB — HCV AB W REFLEX TO QUANT PCR: HCV Ab: <0.1 s/co ratio (ref 0.0–0.9)

## 2021-08-19 LAB — HCV INTERPRETATION

## 2021-08-23 ENCOUNTER — Other Ambulatory Visit (HOSPITAL_COMMUNITY)
Admission: RE | Admit: 2021-08-23 | Discharge: 2021-08-23 | Disposition: A | Discharge status: Home / Self Care | Payer: Self-pay | Source: Ambulatory Visit | Attending: Family Medicine | Admitting: Family Medicine

## 2021-08-23 ENCOUNTER — Encounter: Payer: Self-pay | Admitting: Family Medicine

## 2021-08-23 ENCOUNTER — Telehealth: Payer: Self-pay

## 2021-08-23 ENCOUNTER — Other Ambulatory Visit: Payer: Self-pay

## 2021-08-23 ENCOUNTER — Ambulatory Visit (INDEPENDENT_AMBULATORY_CARE_PROVIDER_SITE_OTHER): Payer: Self-pay | Admitting: Family Medicine

## 2021-08-23 VITALS — BP 112/70 | HR 94 | Wt 230.5 lb

## 2021-08-23 DIAGNOSIS — Z3480 Encounter for supervision of other normal pregnancy, unspecified trimester: Secondary | ICD-10-CM

## 2021-08-23 NOTE — Telephone Encounter (Signed)
Attempted to reach patient through interpreter Freida Busman [phone]. No answer LVM stating that patient has appt for Detailed Anatomy Scan on Mon. Sept 12th at 11:30. Salvatore Marvel, CMA

## 2021-08-23 NOTE — Progress Notes (Signed)
Patient Name: Phoebie Fudge Date of Birth: 1981-01-23 [place] Initial Prenatal Visit  Tilden Inzer is a 40 y.o. G1P0 at 49w1dby LMP who presents for her initial prenatal visit. Pregnancy is planned. Removed her IUD in April 2022. She reports breast tenderness and nausea. No vomiting. She is taking a prenatal vitamin.  She denies pelvic pain or vaginal bleeding.   Pregnancy Dating: The patient is dated by LMP.  LMP: [id number] is certain:  Yes.  Periods were regular:  Yes.  LMP was a typical period:  Yes.  Using hormonal contraception in 3 months prior to conception? IUD, removed April 2022  Lab Review: Blood type: O Rh Status: + Antibody screen: Negative HIV: Negative RPR: Negative Hemoglobin electrophoresis reviewed: Yes Results of OB urine culture are: Negative Rubella: Immune Hep C Ab: Negative Varicella status is Unknown. Will obtain varicella at next appt  PMH: Reviewed and as detailed below: HTN: No  Gestational Hypertension/preeclampsia: No  Type 1 or 2 Diabetes: No  Depression:  No  Seizure disorder:  No VTE: No ,  History of STI No,  Abnormal Pap smear:  No, Genital herpes simplex:  No   PSH: Gynecologic Surgery:  no Surgical history reviewed, notable for: no prior surgeries  Obstetric History: Obstetric history tab updated and reviewed.  Summary of prior pregnancies: [id number] prior vaginal deliveries- first in 2002 she did not have prenatal care and baby was born preterm (at 760 months she is unsure of weeks, had 1 month NICU stay). Remainder of pregnancies uncomplicated. Cesarean delivery: No  Gestational Diabetes:  No Hypertension in pregnancy: No History of preterm birth: Yes History of LGA/SGA infant:  No History of shoulder dystocia: No Indications for referral were reviewed, and the patient [facility]   Social History: Partner's name: JRoswell Miners  Tobacco use: No Alcohol use:  No Other substance use:  No  Current Medications:  Prenatal vitamin No other medications  Reviewed and appropriate in pregnancy.   Genetic and Infection Screen: Flow Sheet Updated Yes  Prenatal Exam: Gen: Well nourished, well developed.  No distress.  Vitals noted. HEENT: Normocephalic, atraumatic.  Neck supple CV: RRR no murmur, gallops or rubs Lungs: CTA B.  Normal respiratory effort without wheezes or rales. Abd: soft, NTND. +BS.  GU: Normal external female genitalia without lesions.  Nl vaginal, well rugated without lesions. No vaginal discharge.  Bimanual exam: No adnexal mass or TTP. No CMT.  Uterus size consistent with dates Ext: No clubbing, cyanosis or edema. Psych: Normal grooming and dress.  Not depressed or anxious appearing.  Normal thought content and process without flight of ideas or looseness of associations  Fetal heart tones: Appropriate  Assessment/Plan:  AShatorria Ofarrellis a 40y.o. G1P0 at 120w1dy LMP who presents to initiate prenatal care. She is doing well.   Routine prenatal care: As dating is reliable, a dating ultrasound has not been ordered. Dating tab updated. Pre-pregnancy weight updated. Expected weight gain this pregnancy is 11-20 pounds  Prenatal labs reviewed- normal results Indications for referral to [facility] were reviewed and the patient does not meet criteria for referral.  Medication list reviewed and updated.  Recommended patient see a dentist for regular care.  Bleeding and pain precautions reviewed. Importance of prenatal vitamins reviewed.  Genetic screening offered. Patient opted for: undecided although most likely no screening, as [employer] doesn't cover costs The patient has the  following indications for aspirin '81mg'$  to begin at 12-16 weeks: One high risk condition: no single high risk condition  MORE than one moderate risk condition: obesity, low SES  , and Age 12 or older  Aspirin was  recommended  today based upon above risk factors (one high risk condition or more than one moderate risk factor)  The patient will be age 40 or over at time of delivery. Referral to genetic counseling was offered today, patient declines. The patient has the following risk factors for preexisting diabetes: BMI > 25 and high risk ethnicity (Latino, [place] American, Native American, [place], Asian Optometrist) . An early 1 hour glucose tolerance test to be completed at next visit.  Pregnancy Medical Home and PHQ-9 forms completed, problems noted: No  2. Pregnancy issues include the following which were addressed today:  Pap, GC/chlamydia done today As patient is [redacted]w[redacted]d [facility]. [facility]. Language barrier: in-person spanish interpreter present for duration of encounter.   Follow up 4 weeks for next prenatal visit.   AAlcus Dad MD

## 2021-08-23 NOTE — Patient Instructions (Addendum)
You should start taking a baby aspirin ('81mg'$ ) every day with your prenatal vitamin. You can go to the health department to get more prenatal vitamins.  Segundo trimestre de [employer] Second Trimester of Pregnancy El segundo trimestre de Media planner va desde la semana 13 hasta la semana 27. Es Software engineer desde el mes 4 hasta el mes 6 de Proctor. El segundo trimestre suele ser el momento en el que mejor se siente. Su organismo se ha adaptado a Public relations account executive, y comienza a Print production planner. Durante el segundo trimestre: Las nuseas del embarazo han disminuido o han desaparecido completamente. Usted puede tener ms energa. Es posible que tenga un aumento del apetito. El segundo trimestre es tambin un perodo en el que el beb en gestacin (feto) crece rpidamente. Hacia el final del sexto mes, el feto puede medir aproximadamente 12 pulgadas y pesar alrededor de 1 libras. Es probable que sienta que el beb se mueve (da pataditas) entre las 16 y 36 semanas del Media planner. Cambios en el cuerpo durante el segundo trimestre Su cuerpo continua experimentando numerosos cambios durante su segundo trimestre. Los cambios varan y generalmente vuelven a la normalidad despus del nacimiento del beb. Cambios fsicos Seguir [employer] Family Insurance. Notar que la parte baja del abdomen sobresale. Podrn aparecer las primeras [employer] Corporation caderas, el abdomen y las [place]. Las [employer] National Corporation seguirn creciendo y se tornarn sensibles. Pueden aparecer zonas oscuras o manchas (cloasma o mscara del embarazo) en el rostro. Es posible que se forme una lnea oscura desde el ombligo hasta la zona del pubis (linea nigra). Tal vez haya cambios en el cabello. Esto cambios pueden incluir su engrosamiento, crecimiento rpido y [employer]son textura. A algunas personas tambin se les cae el cabello durante o despus del [place], o tienen el cabello seco o fino. Cambios en la salud Comienza a tener dolores de [place]. Es posible que  tenga acidez estomacal. Puede tener estreimiento. Pueden aparecer hemorroides o abultarse e hincharse las venas (venas varicosas). Las Production manager y estar sensibles al cepillado y al hilo dental. Letitia Caul vez tenga necesidad de orinar con ms frecuencia porque el feto est ejerciendo presin sobre la vejiga. Puede sentir dolor en la espalda. Esto se debe a: Aumento de peso. Las hormonas del [place] articulaciones en la pelvis. Un cambio en el peso y los msculos que ayudan a Theatre manager su equilibrio. Siga estas instrucciones en su casa: [place] instrucciones del mdico en relacin con el uso de medicamentos. Durante el embarazo, hay medicamentos que pueden tomarse y 35 que no. No tome medicamentos a menos que lo haya autorizado el mdico. Tome vitaminas prenatales que contengan por lo menos 600 microgramos (mcg) de cido flico. Comida y bebida Lleve una dieta saludable que incluya frutas y verduras frescas, cereales integrales, buenas fuentes de protenas como carnes [place], huevos o tofu, y productos lcteos descremados. Evite la carne cruda y el [place], la [place] y el queso sin Radio producer. Estos portan grmenes que pueden provocar dao tanto a usted como al beb. Es posible que tenga que tomar estas medidas para prevenir o tratar el estreimiento: Electronics engineer suficiente lquido como para Theatre manager la orina de color amarillo plido. Consumir alimentos ricos en fibra, como frijoles, cereales integrales, y frutas y verduras frescas. Limitar el consumo de alimentos ricos en grasa y azcares procesados, como los alimentos fritos o dulces. Actividad Haga ejercicio solamente como se lo haya indicado el mdico. La mayora de las personas pueden continuar su rutina de [place]  durante el embarazo. Intente realizar como mnimo 30 minutos de actividad fsica por lo menos 5 das a la [place]. Deje de hacer ejercicio si comienza a Administrator, arts. Deje de hacer  ejercicio si le aparecen dolor o clicos en la parte baja del vientre o de la espalda. Evite hacer ejercicio si hace mucho calor o humedad, o si se encuentra a una altitud elevada. Evite levantar pesos EMC[employer]. Si lo desea, puede seguir teniendo [employer], salvo que el mdico le indique lo contrario. Alivio del dolor y del Tree surgeon Use un sujetador que le brinde buen soporte para prevenir las molestias causadas por la sensibilidad en las [place]. Dese baos de asiento con agua tibia para Best boy o las molestias causadas por las hemorroides. Use una crema para las hemorroides si el mdico la autoriza. Descanse con las piernas levantadas (elevadas) si tiene calambres en las piernas o dolor en la parte baja de la espalda. Si tiene venas varicosas: Use medias de compresin como se lo haya indicado el mdico. Eleve los pies durante 15 minutos, 3 o 4 veces por da. Limite el consumo de sal en su dieta. Seguridad Use el cinturn de seguridad en todo momento mientras conduce o va en auto. Hable con el mdico si es vctima de Terex Corporation o fsico. Estilo de vida No se d baos de inmersin en agua caliente, baos turcos ni saunas. No se haga duchas vaginales. No use tampones ni toallas higinicas perfumadas. Evite el contacto con las bandejas sanitarias de los gatos y la tierra que estos animales usan. Estos elementos contienen bacterias que pueden causar defectos congnitos al beb y la posible prdida del feto debido a un aborto espontneo o muerte fetal. No use remedios a base de hierbas, alcohol, drogas ilegales ni medicamentos que no estn aprobados por el mdico. Las sustancias qumicas de estos productos pueden daar al beb. No consuma ningn producto que contenga nicotina o tabaco, como cigarrillos, cigarrillos electrnicos y tabaco de Higher education careers adviser. Si necesita ayuda para dejar de fumar, consulte al mdico. Instrucciones generales Durante una visita prenatal de rutina, el  [employer] har un examen fsico y Equities trader. Tambin le hablar sobre su salud general. Cumpla con todas las visitas de seguimiento. Esto es importante. Pdale al mdico que la derive a clases de educacin prenatal en su localidad. Pida ayuda si tiene necesidades nutricionales o de asesoramiento Solicitor. El mdico puede aconsejarla o derivarla a especialistas para que la ayuden con diferentes necesidades. Dnde buscar ms informacin American Pregnancy Association (Asociacin Americana del Embarazo): americanpregnancy.org SPX Corporation of Obstetricians and Gynecologists (Colegio Estadounidense de Obstetras y [place]): SwimmingTub.[email or web address].br? Office on Home Depot ([place]): [email or web address].br Comunquese con un mdico si tiene: Un dolor de cabeza que no desaparece despus de Teacher, adult education. Cambios en la visin o ve manchas delante de los ojos. Clicos leves, presin en la pelvis o dolor persistente en el abdomen. Nuseas persistentes, vmitos o diarrea. Secrecin vaginal con mal olor u orina con USAA ftido. Dolor al [place]. Hinchazn sbita o extrema del rostro, las manos, los tobillos, los pies o las piernas. Cristy Hilts. Busque ayuda de inmediato si: Tiene una prdida de lquido por la vagina. Tiene sangrado ligero o manchas vaginales. Tiene dolor intenso o clicos en el abdomen. Presenta dificultad para respirar. Siente dolor en el pecho. Tiene episodios de Kimberly[employer]ark. No ha sentido a su beb moverse durante el perodo de Agilent Technologies indic el mdico. Scientist, clinical (histocompatibility and immunogenetics),  hinchazn o enrojecimiento nuevos o ms intensos en un brazo o una pierna. Resumen El segundo trimestre de embarazo va desde la semana 13 hasta la 27 (desde el mes 4 hasta el 6). No use remedios a base de hierbas, alcohol, drogas ilegales ni medicamentos que no estn aprobados por el mdico. Las sustancias qumicas de estos productos pueden daar al  beb. Haga ejercicio solamente como se lo haya indicado el mdico. La mayora de las personas pueden continuar su rutina de ejercicios durante el [place]. Cumpla con todas las visitas de seguimiento. Esto es importante. Esta informacin no tiene Marine scientist el consejo del mdico. Asegrese de hacerle al mdico cualquier pregunta que tenga. Document Revised: 06/11/2020 Document Reviewed: 06/11/2020 Elsevier Patient Education  [place].

## 2021-08-24 DIAGNOSIS — Z3480 Encounter for supervision of other normal pregnancy, unspecified trimester: Secondary | ICD-10-CM | POA: Insufficient documentation

## 2021-08-25 ENCOUNTER — Encounter: Payer: Self-pay | Admitting: Family Medicine

## 2021-08-28 LAB — CYTOLOGY - PAP
Chlamydia: NEGATIVE
Comment: NEGATIVE
Comment: NEGATIVE
Comment: NORMAL
Diagnosis: NEGATIVE
High risk HPV: NEGATIVE
Neisseria Gonorrhea: NEGATIVE

## 2021-09-12 NOTE — Progress Notes (Signed)
  [place] Prenatal Visit  Jaena Brocato is a 40 y.o. [id number] at [redacted]w[redacted]d here for routine follow up. She is dated by LMP.  She reports no complaints.  Has not yet noticed fetal movement. No bleeding, loss of fluid, contractions. See flow sheet for details. Vitals:   09/13/21 1332  BP: 104/80  Pulse: 88   A/P: Pregnancy at [redacted]w[redacted]d.  Doing well.   Dating reviewed, dating tab is correct Fetal heart tones Appropriate- 141bpm Fundal height within expected range. - 23cm Ultrasound done at [place] on 9/12. Will scan into media tab. Results reviewed. Shows single live IUP at [redacted]w[redacted]d (-9 days from LMP). She will need anatomy ultrasound- CMA to schedule. Influenza vaccine: discussed. Patient will receive at [facility] for financial reasons COVID vaccination was not discussed today Indications for screening for preexisting diabetes include: BMI > 25 and high risk ethnicity (Latino, African American, Native American, [place], Asian American) - early 1hr gtt done today. Results wnl (126). Pregnancy education provided on the following topics: fetal growth and movement, ultrasound assessment, and upcoming laboratory assessment.  Advised to contact our office if she has not yet felt fetal movement in 1-2 weeks. Scheduled for [facility] during second trimester on 10/03/2021. Preterm labor precautions given.  Continue PNV and ASA  2. Pregnancy issues include the following and were addressed as appropriate today:   Language barrier- in-person Spanish interpreter used for duration of encounter  Advanced maternal age- declined genetic screening/referral  Varicella unknown- thinks she had chicken pox but isn't really sure. Consider obtaining next time labs are drawn.  Undecided on postpartum contraception- will continue to discuss at future appointments  Problem list and pregnancy box updated: Yes.    Follow up 4 weeks.

## 2021-09-13 ENCOUNTER — Ambulatory Visit (INDEPENDENT_AMBULATORY_CARE_PROVIDER_SITE_OTHER): Payer: Self-pay | Admitting: Family Medicine

## 2021-09-13 ENCOUNTER — Other Ambulatory Visit: Payer: Self-pay

## 2021-09-13 VITALS — BP 104/80 | HR 88 | Wt 233.6 lb

## 2021-09-13 DIAGNOSIS — Z3480 Encounter for supervision of other normal pregnancy, unspecified trimester: Secondary | ICD-10-CM

## 2021-09-13 NOTE — Patient Instructions (Addendum)
Pregnancy Related Return Precautions The follow are signs/symptoms that are abnormal in pregnancy and may require further evaluation by a physician: Go to the MAU at [place] at [facility] if: -You have cramping/contractions that do not go away with drinking water, especially if they are lasting 30 seconds to 1.5 minutes, coming and going every 5-10 minutes for an hour or more, or are getting stronger and you cannot walk or talk while having a contraction/cramp. -Your water breaks.  Sometimes it is a big gush of fluid, sometimes it is just a trickle that keeps getting your underwear wet or running down your legs -You have vaginal bleeding.    -You do not feel your baby moving like normal.  If you do not, get something to eat and drink (something cold or something with sugar like peanut butter or juice) and lay down and focus on feeling your baby move. If your baby is still not moving like normal, you should go to MAU. You should feel your baby move 6 times in one hour, or 10 times in two hours. -You have a persistent headache that does not go away with 1 g of Tylenol, vision changes, chest pain, difficulty breathing, severe pain in your right upper abdomen, worsening leg swelling- these can all be signs of high blood pressure in pregnancy and need to be evaluated by a provider immediately  These are all concerning in pregnancy and if you have any of these I recommend you call your PCP and present to the Maternity Admissions Unit (map below) for further evaluation.  For any pregnancy-related emergencies, please go to the Maternity Admissions Unit in the [place] at [place] will use hospital Entrance C.     Segundo trimestre de [employer] Second Trimester of Pregnancy El segundo trimestre de Media planner va desde la semana 13 hasta la semana 27. Es Software engineer desde el mes 4 hasta el mes 6 de [place]. El segundo trimestre suele ser el momento en el que  mejor se siente. Su organismo se ha adaptado a Public relations account executive, y comienza a Print production planner. Durante el segundo trimestre: Las nuseas del embarazo han disminuido o han desaparecido completamente. Usted puede tener ms energa. Es posible que tenga un aumento del apetito. El segundo trimestre es tambin un perodo en el que el beb en gestacin (feto) crece rpidamente. Hacia el final del sexto mes, el feto puede medir aproximadamente 12 pulgadas y pesar alrededor de 1 libras. Es probable que sienta que el beb se mueve (da pataditas) entre las 16 y 66 semanas del Media planner. Cambios en el cuerpo durante el segundo trimestre Su cuerpo continua experimentando numerosos cambios durante su segundo trimestre. Los cambios varan y generalmente vuelven a la normalidad despus del nacimiento del beb. Cambios fsicos Seguir American Family Insurance. Notar que la parte baja del abdomen sobresale. Podrn aparecer las primeras Apache Corporation caderas, el abdomen y las [place]. Las Lincoln National Corporation seguirn creciendo y se tornarn sensibles. Pueden aparecer zonas oscuras o manchas (cloasma o mscara del embarazo) en el rostro. Es posible que se forme una lnea oscura desde el ombligo hasta la zona del pubis (linea nigra). Tal vez haya cambios en el cabello. Esto cambios pueden incluir su engrosamiento, crecimiento rpido y Harley[employer]Davidson textura. A algunas personas tambin se les cae el cabello durante o despus del [place], o tienen el cabello seco o fino. Cambios en la salud Comienza a tener dolores de [place]. Es posible que  tenga acidez estomacal. Puede tener estreimiento. Pueden aparecer hemorroides o abultarse e hincharse las venas (venas varicosas). Las Production manager y estar sensibles al cepillado y al hilo dental. Letitia Caul vez tenga necesidad de orinar con ms frecuencia porque el feto est ejerciendo presin sobre la vejiga. Puede sentir dolor en la espalda. Esto se debe a: Aumento de peso. Las  hormonas del [place] articulaciones en la pelvis. Un cambio en el peso y los msculos que ayudan a Theatre manager su equilibrio. Siga estas instrucciones en su casa: [place] instrucciones del mdico en relacin con el uso de medicamentos. Durante el embarazo, hay medicamentos que pueden tomarse y 56 que no. No tome medicamentos a menos que lo haya autorizado el mdico. Tome vitaminas prenatales que contengan por lo menos 600 microgramos (mcg) de cido flico. Comida y bebida Lleve una dieta saludable que incluya frutas y verduras frescas, cereales integrales, buenas fuentes de protenas como carnes Watson, huevos o tofu, y productos lcteos descremados. Evite la carne cruda y el [place], la [place] y el queso sin Radio producer. Estos portan grmenes que pueden provocar dao tanto a usted como al beb. Es posible que tenga que tomar estas medidas para prevenir o tratar el estreimiento: Electronics engineer suficiente lquido como para Theatre manager la orina de color amarillo plido. Consumir alimentos ricos en fibra, como frijoles, cereales integrales, y frutas y verduras frescas. Limitar el consumo de alimentos ricos en grasa y azcares procesados, como los alimentos fritos o dulces. Actividad Haga ejercicio solamente como se lo haya indicado el mdico. La mayora de las personas pueden continuar su rutina de ejercicios durante el [place]. Intente realizar como mnimo 30 minutos de actividad fsica por lo menos 5 das a la [place]. Deje de hacer ejercicio si comienza a Administrator, arts. Deje de hacer ejercicio si le aparecen dolor o clicos en la parte baja del vientre o de la espalda. Evite hacer ejercicio si hace mucho calor o humedad, o si se encuentra a una altitud elevada. Evite levantar pesos EMCOR. Si lo desea, puede seguir teniendo Office Depot, salvo que el mdico le indique lo contrario. Alivio del dolor y del Tree surgeon Use un sujetador que le brinde buen soporte  para prevenir las molestias causadas por la sensibilidad en las [place]. Dese baos de asiento con agua tibia para Best boy o las molestias causadas por las hemorroides. Use una crema para las hemorroides si el mdico la autoriza. Descanse con las piernas levantadas (elevadas) si tiene calambres en las piernas o dolor en la parte baja de la espalda. Si tiene venas varicosas: Use medias de compresin como se lo haya indicado el mdico. Eleve los pies durante 15 minutos, 3 o 4 veces por da. Limite el consumo de sal en su dieta. Seguridad Use el cinturn de seguridad en todo momento mientras conduce o va en auto. Hable con el mdico si es vctima de Terex Corporation o fsico. Estilo de vida No se d baos de inmersin en agua caliente, baos turcos ni saunas. No se haga duchas vaginales. No use tampones ni toallas higinicas perfumadas. Evite el contacto con las bandejas sanitarias de los gatos y la tierra que estos animales usan. Estos elementos contienen bacterias que pueden causar defectos congnitos al beb y la posible prdida del feto debido a un aborto espontneo o muerte fetal. No use remedios a base de hierbas, alcohol, drogas ilegales ni medicamentos que no estn aprobados por el mdico. Las sustancias qumicas de estos productos pueden  daar al beb. No consuma ningn producto que contenga nicotina o tabaco, como cigarrillos, cigarrillos electrnicos y tabaco de Higher education careers adviser. Si necesita ayuda para dejar de fumar, consulte al mdico. Instrucciones generales Durante una visita prenatal de rutina, el Viacom har un examen fsico y Equities trader. Tambin le hablar sobre su salud general. Cumpla con todas las visitas de seguimiento. Esto es importante. Pdale al mdico que la derive a clases de educacin prenatal en su localidad. Pida ayuda si tiene necesidades nutricionales o de asesoramiento Solicitor. El mdico puede aconsejarla o derivarla a especialistas para que la ayuden  con diferentes necesidades. Dnde buscar ms informacin American Pregnancy Association (Asociacin Americana del Embarazo): americanpregnancy.org SPX Corporation of Obstetricians and Gynecologists (Colegio Estadounidense de Obstetras y [place]): SwimmingTub.com.br? Office on Home Depot ([place]): [email or web address].br Comunquese con un mdico si tiene: Un dolor de cabeza que no desaparece despus de Teacher, adult education. Cambios en la visin o ve manchas delante de los ojos. Clicos leves, presin en la pelvis o dolor persistente en el abdomen. Nuseas persistentes, vmitos o diarrea. Secrecin vaginal con mal olor u orina con USAA ftido. Dolor al Su [place]. Hinchazn sbita o extrema del rostro, las manos, los tobillos, los pies o las piernas. Cristy Hilts. Busque ayuda de inmediato si: Tiene una prdida de lquido por la vagina. Tiene sangrado ligero o manchas vaginales. Tiene dolor intenso o clicos en el abdomen. Presenta dificultad para respirar. Siente dolor en el pecho. Tiene episodios de Kimberly-Clark. No ha sentido a su beb moverse durante el perodo de Agilent Technologies indic el mdico. Tiene dolor, hinchazn o enrojecimiento nuevos o ms intensos en un brazo o una pierna. Resumen El segundo trimestre de embarazo va desde la semana 13 hasta la 27 (desde el mes 4 hasta el 6). No use remedios a base de hierbas, alcohol, drogas ilegales ni medicamentos que no estn aprobados por el mdico. Las sustancias qumicas de estos productos pueden daar al beb. Haga ejercicio solamente como se lo haya indicado el mdico. La mayora de las personas pueden continuar su rutina de ejercicios durante el [place]. Cumpla con todas las visitas de seguimiento. Esto es importante. Esta informacin no tiene Marine scientist el consejo del mdico. Asegrese de hacerle al mdico cualquier pregunta que tenga. Document Revised: 06/11/2020 Document Reviewed:  06/11/2020 Elsevier Patient Education  Optima.

## 2021-09-14 LAB — GLUCOSE TOLERANCE, 1 HOUR: Glucose, 1Hr PP: 126 mg/dL (ref 65–199)

## 2021-09-18 ENCOUNTER — Telehealth: Payer: Self-pay

## 2021-09-18 NOTE — Telephone Encounter (Signed)
Called patient and LVM for ultrasound appointment.  Assisted by [place] # [phone].  Detailed Anatomy Scan 14 + weeks 09/26/2021 @ [facility]   .Ozella Almond, CMA

## 2021-10-03 ENCOUNTER — Encounter: Payer: Self-pay | Admitting: Family Medicine

## 2021-10-03 ENCOUNTER — Other Ambulatory Visit: Payer: Self-pay

## 2021-10-03 ENCOUNTER — Ambulatory Visit (INDEPENDENT_AMBULATORY_CARE_PROVIDER_SITE_OTHER): Payer: Self-pay | Admitting: Family Medicine

## 2021-10-03 VITALS — BP 119/83 | HR 87 | Wt 237.0 lb

## 2021-10-03 DIAGNOSIS — Z3480 Encounter for supervision of other normal pregnancy, unspecified trimester: Secondary | ICD-10-CM

## 2021-10-03 NOTE — Patient Instructions (Signed)
I have translated the following text using Google translate.  As such, there are many errors.  I apologize for the poor written translation; however, we do not have written  translation services yet.  He traducido el siguiente texto [employer] traductor de [employer]. Como tal, hay muchos errores. Pido disculpas por la mala traduccin escrita; sin embargo, an no contamos con servicios de traduccin escrita.  It was wonderful to meet you today. Thank you for allowing me to be a part of your care. Below is a short summary of what we discussed at your visit today: Fue maravilloso conocerte hoy. Gracias por permitirme ser parte de su cuidado. A continuacin se muestra un breve resumen de lo que discutimos en su visita de hoy:  Emergency Planning  Planificacin de emergencias Si experimenta sangrado vaginal, prdida de lquidos, no siente que su beb se mueva tanto o comienza a Field seismologist con menos de 5 minutos de diferencia, vaya directamente a la Unidad de Evaluacin Materna en [place] para una evaluacin.  Servicios de atencin de mujeres y maternidad ubicados en el lado sur de [place]. [facility] ([place]). [place], [place]       Maternal Mental Health If you start to develop the below symptoms of depression, please reach out to [place] for an appointment. There is also a [place] at [phone]). This hotline has trained counselors, doulas, and midwifes to real-time support, information, and resources.  Feeling sad or hopeless most of the time Lack of interest in things you used to enjoy Less interest in caring for yourself (dressing, fixing hair) Trouble concentrating Trouble coping with daily tasks Constant worry about your baby Sleeping or eating too much or too little Feeling very anxious or nervous Unexplained irritability or  anger Unwanted or scary thoughts Feeling that you are not a good mother Thoughts of hurting yourself or your baby  If you feel you are experiencing a mental health crisis, please reach out to the [place] at 1-800-273-TALK [phone]) or go directly to the [facility], open 24/7.  [phone] [address] [place], [place] [address]   Salud Mental Materna Si comienza a desarrollar los siguientes sntomas de depresin, comunquese con nosotros para programar una cita. Tambin hay una lnea directa nacional de salud mental materna en el [phone]). Esta lnea directa ha capacitado a consejeros, doulas y parteras para brindar 16, informacin y recursos en tiempo real.  Caro Hight triste o sin [place] inters en las cosas que sola disfrutar  Menos inters en cuidarse a s mismo (vestirse, arreglarse el cabello)  Problemas para concentrarse  Problemas para hacer frente a las tareas diarias  Preocupacin constante por su beb  Dormir o Mudlogger o muy poco  Sentirse muy ansioso o nervioso  Irritabilidad o ira inexplicables  Pensamientos no deseados o Aeronautical engineer que no eres una buena madre  Pensamientos de Glass blower/designer a s Consulting civil engineer o a su beb  Si cree que est experimentando una crisis de salud mental, comunquese con la Bristol[employer]Myers Squ[employer]b de Prevencin del Suicidio al [phone]) o dirjase directamente a la Atencin de [place] de Salud del Comportamiento del [place] de [place], [place] 24/7. [phone] [address], [place], [place] [address]   If you have any questions or concerns, please do not  hesitate to contact us via phone or MyChart message.   Regina Essex, MD

## 2021-10-03 NOTE — Progress Notes (Signed)
  [place] Prenatal Visit  Annalia Metzger is a 40 y.o. [phone] at [redacted]w[redacted]d here for routine follow up. She is dated by LMP.  She reports no complaints.  She reports good fetal movement. No bleeding, loss of fluid, contractions. See flow sheet for details. Vitals:   10/03/21 0923  BP: 133/77  Pulse: 87    A/P: Pregnancy at [redacted]w[redacted]d.  Doing well.   Dating reviewed, dating tab is correct Fetal heart tones Appropriate - 140 bpm Fundal height within expected range.  26 cm, soft feeling fundus, difficult to palpate due to habitus Anatomy ultrasound performed 10/13 at health department, awaiting report.  Influenza vaccine not given today as mom is in the Adopt-A-Mom program. She was given a letter of approval to take to HD.  COVID vaccination was discussed. She has received three monovalent COVID vaccines, last 6 months ago. She declines booster at this time.  Indications for screening for preexisting diabetes include: BMI > 25 and high risk ethnicity (Latino, [place] American, Native American, [place], Asian Optometrist) .  Pregnancy education provided on the following topics: fetal growth and movement, ultrasound assessment, and upcoming laboratory assessment.   The patient has the following indications for aspirinto begin 81 mg at 12-16 weeks: One high risk condition: History of preeclampsia MORE than one moderate risk condition: obesity and Age 5 or older  Aspirin was  recommended today based upon above risk factors (one high risk condition or more than one moderate risk factor)  Preterm labor precautions given.     2. Pregnancy issues include the following and were addressed as appropriate today:   [place]: Anatomy ultrasound performed 10/13 at health department, awaiting report.  F/u at next visit.  Flu: Influenza vaccine not given today as mom is in the Adopt-A-Mom program. She was given a letter of approval to take to HD. F/u at next appointment and ensure this was given.   TDaP: Needs TDaP at 28 weeks. At next appointment, make sure to give letter of approval for her to take to health department.  COVID: She has received three monovalent COVID vaccines. She declines booster at this time. Discuss and offer at next appointment.  Elevated BP measurement: Initial 133/77, subsequent 119/83. Will continue to monitor.   Problem list and pregnancy box updated: Yes.    Follow up 4 weeks. Scheduled with Dr. Rock Nephew.   Ezequiel Essex, MD

## 2021-10-11 ENCOUNTER — Telehealth: Payer: Self-pay | Admitting: Family Medicine

## 2021-10-11 NOTE — Telephone Encounter (Signed)
Attempted to reach patient via Spanish phone interpreter to inform of normal anatomy scan results. No answer. Left hipaa-compliant VM via interpreter asking her to call back.  If patient returns call, please inform her of normal ultrasound result.   If she asks, gender appears to be FEMALE.  Leeanne Rio, MD

## 2021-11-12 ENCOUNTER — Ambulatory Visit (INDEPENDENT_AMBULATORY_CARE_PROVIDER_SITE_OTHER): Payer: Self-pay | Admitting: Family Medicine

## 2021-11-12 ENCOUNTER — Other Ambulatory Visit: Payer: Self-pay

## 2021-11-12 VITALS — BP 119/79 | HR 92 | Wt 238.5 lb

## 2021-11-12 DIAGNOSIS — Z3480 Encounter for supervision of other normal pregnancy, unspecified trimester: Secondary | ICD-10-CM

## 2021-11-12 NOTE — Progress Notes (Signed)
  [place] Prenatal Visit  Regina Cunningham is a 40 y.o. [phone] at [redacted]w[redacted]d here for routine follow up. She is dated by LMP.  She reports no complaints. She reports fetal movement. She denies vaginal bleeding, contractions, or loss of fluid. See flow sheet for details.  Vitals:   11/12/21 0922  BP: 119/79  Pulse: 92    A/P: Pregnancy at [redacted]w[redacted]d.  Doing well.   Routine prenatal care:  Dating reviewed, dating tab is correct Fetal heart tones Appropriate (142) Fundal height within expected range. Somewhat difficult due to body habitus. Infant feeding choice: Formula feeding  Contraception choice: Undecided, likely IUD Infant circumcision desired? no  The patient does not have a history of Cesarean delivery and no referral to Center for [facility] is indicated Influenza vaccine not given today as she is in the Adopt-A-Mom program. She was given a letter of approval to take to the Health Department. Tdap was not given today as she is in the Adopt-A-Mom program. She was given a letter of approval to take to the Health Department. 1 hour glucola, CBC, RPR, and HIV were obtained today.  Varicella antibody also obtained due to unknown status.  Rh status was reviewed and patient does not need Rhogam.  Rhogam was not given today.  PHQ-9 forms were done today and reviewed.   Pregnancy education regarding benefits of breastfeeding, contraception, fetal growth, expected weight gain, and safe infant sleep were discussed.  Preterm labor and fetal movement precautions reviewed. Patient remains on prenatal vitamins and ASA 81mg   2. Pregnancy issues include the following and were addressed as appropriate today:   Language barrier (spanish): in-person interpreter used for duration of encounter  Advanced maternal age- declined genetic screening/referral  Problem list and pregnancy box updated: Yes.   Patient scheduled in [facility] during third trimester on 01/02/2022.  Follow up  2 weeks.

## 2021-11-12 NOTE — Patient Instructions (Signed)
Tercer trimestre de Media planner Third Trimester of Pregnancy El tercer trimestre de embarazo va desde la semana 28 hasta la semana 40. Tambin se dice que va desde el mes 7 hasta el mes 9. En este trimestre, el beb en gestacin (feto) crece muy rpidamente. Hacia el final del noveno mes, el beb en gestacin mide alrededor de 20pulgadas (45cm) de largo. Pesa entre 6y 10libras [id number]). Cambios en el cuerpo durante el tercer trimestre Su organismo contina atravesando por muchos cambios durante este perodo. Los cambios varan y generalmente vuelven a la normalidad despus del nacimiento del beb. Cambios fsicos Seguir [employer]. Puede ser que [place] 25 y 35libras (6 y 16kg) hacia el final del Media planner. Si tiene [employer] Services, puede aumentar entre 28 y 40lb (unos 29 a 18kg). Si tiene sobrepeso, puede aumentar entre 15 y 25 libras (unos 7 a 11kg). Podrn aparecer las primeras estras en las caderas, el vientre (abdomen) y las [place]. [place] [employer] seguirn creciendo y Tourist information centre manager. Un lquido amarillo Public affairs consultant) puede salir de sus pechos. Esta es la primera leche que usted produce para el beb. Tal vez haya cambios en el cabello. El ombligo puede salir hacia afuera. Puede observar que se le hinchan ms las manos, la cara o los tobillos. Cambios en la salud Es posible que tenga acidez estomacal. Es posible que tenga dificultades para defecar (estreimiento). Pueden aparecerle hemorroides. Estas son venas hinchadas en el ano que pueden picar o doler. Puede comenzar a tener venas hinchadas (vrices) en las piernas. Puede presentar ms dolor en la pelvis, la espalda o los muslos. Puede presentar ms hormigueo o entumecimiento en las manos, los brazos y las piernas. La piel de su vientre tambin puede sentirse entumecida. Es posible que sienta falta de aire a medida que el tero se [place]. Otros cambios Es posible que haga pis (orine) con mayor frecuencia. Puede tener ms  problemas para dormir. Puede notar que el beb en gestacin "baja" o se mueve ms hacia bajo, en el vientre. Puede notar ms secrecin proveniente de la vagina. Puede sentir las articulaciones flojas y Agricultural consultant alrededor del hueso plvico. Siga estas instrucciones en su casa: Medicamentos Use los medicamentos de venta libre y los recetados solamente como se lo haya indicado el mdico. Algunos medicamentos no son seguros Solicitor. Tome vitaminas prenatales que contengan por lo menos [id number] (mcg) de cido flico. Comida y bebida Consuma comidas saludables que incluyan lo siguiente: Lambert Mody y verduras frescas. Cereales integrales. Buenas fuentes de protenas, como carne, huevos y tofu. Productos lcteos con bajo contenido de [place]. Evite la carne cruda y el [place], la [place] y el queso sin Radio producer. Estos portan grmenes que pueden provocar dao tanto a usted como al beb. Tome 4 o 5 comidas pequeas en lugar de 3 comidas abundantes al da. Es posible que deba tomar medidas para prevenir o tratar los problemas para defecar: Electronics engineer suficiente lquido para Contractor pis (orina) de color amarillo plido. Come alimentos ricos en fibra. Entre ellos, frijoles, cereales integrales y frutas y verduras frescas. Limitar los alimentos con alto contenido de grasa y Location manager. Estos incluyen alimentos fritos o dulces. Actividad Haga ejercicios solamente como se lo haya indicado el mdico. Interrumpa la actividad fsica si comienza a tener clicos en el tero. Evite levantar pesos EMC[employer]. No haga ejercicio si hace demasiado calor, hay demasiada humedad o se encuentra en un lugar de mucha altura (altitud elevada). Si lo desea, puede continuar teniendo [employer] Depot, a [employer]  que el mdico le indique lo contrario. Alivio del dolor y del malestar Haga pausas con frecuencia y descanse con las piernas levantadas (elevadas) si tiene calambres en las piernas o dolor en la parte  baja de la espalda. Dese baos de asiento con agua tibia para Best boy o las molestias causadas por las hemorroides. Use una crema para las hemorroides si el mdico la autoriza. Use un sostn que le brinde buen soporte si sus mamas estn sensibles. Si desarrolla venas hinchadas y abultadas en las piernas: Use medias de compresin segn las indicaciones de su mdico. Levante los pies durante 67minutos, 3 o 4veces por Training and development officer. Limite la sal en sus alimentos. Seguridad Hable con el mdico antes de Control and instrumentation engineer. No se d baos de inmersin en agua caliente, baos turcos ni saunas. Use el cinturn de seguridad en todo momento mientras vaya en auto. Hable con el mdico si alguien le est haciendo dao o gritando [place]. Preparacin para la llegada del beb Para prepararse para la llegada de su beb: Tome clases prenatales. Visite el hospital y recorra el rea de maternidad. Compre un asiento de seguridad [employer] atrs para llevar al beb en el automvil. Aprenda cmo instalarlo en el auto. Prepare la habitacin del beb. Saque todas las almohadas y los animales de peluche de la cuna del beb. Instrucciones generales Evite el contacto con las bandejas sanitarias de los gatos y la tierra que estos animales usan. Estos contienen grmenes que pueden daar al beb y causar la prdida del beb ya sea aborto espontneo o muerte fetal. No se haga duchas vaginales ni use tampones. No use tampones ni toallas higinicas perfumadas. No fume ni consuma ningn producto que contenga nicotina o tabaco. Si necesita ayuda para dejar de fumar, consulte al mdico. No beba alcohol. No use medicamentos a base de hierbas, drogas ilegales, ni medicamentos que el mdico no haya autorizado. Las sustancias qumicas de estos productos pueden afectar al beb. Cumpla con todas las visitas de seguimiento. Esto es importante. Dnde buscar ms informacin American Pregnancy Association (Asociacin  Americana del Embarazo): americanpregnancy.org SPX Corporation of Obstetricians and Gynecologists (Colegio Estadounidense de Obstetras y Gineclogos): www.acog.org Office on Home Depot ([place]): KeywordPortfolios.com.br Comunquese con un mdico si: Tiene fiebre. Tiene clicos leves o siente presin en la parte baja del vientre. Sufre un dolor persistente en el abdomen. Vomita o hace deposiciones acuosas (diarrea). Advierte lquido con mal olor que proviene de la vagina. Siente dolor al orinar o hace orina con mal olor. Tiene un dolor de cabeza que no desaparece despus de Teacher, adult education. Nota cambios en la visin o ve manchas delante de los ojos. Solicite ayuda de inmediato si: Rompe la bolsa. Tiene contracciones regulares separadas por menos de 64minutos. Tiene sangrado o pequeas prdidas vaginales. Tiene clicos o dolor muy intensos en el vientre. Tiene dificultad para respirar. Sientes dolor en el pecho. Se desmaya. No ha sentido al beb moverse durante el tiempo que le indic el mdico. Tiene dolor, hinchazn o enrojecimiento nuevos en un brazo o una pierna o se produce un aumento de alguno de estos sntomas. Resumen El tercer trimestre comprende desde la [employer] Machines la [id number] (desde el mes7 hasta el mes9). Esta es la poca en que el beb en gestacin crece muy rpidamente. Durante este perodo, las molestias pueden aumentar a medida que usted sube de peso y el beb crece. Preprese para la llegada del beb: asista a las clases prenatales, compre un  asiento de seguridad AutoNation atrs para llevar al beb en auto y prepare la habitacin del beb. Solicite ayuda de inmediato si tiene sangrado por la vagina, siente dolor en el pecho y tiene dificultad para respirar, o si no ha sentido al beb moverse durante el tiempo que le indic el mdico. Esta informacin no tiene Marine scientist el consejo del mdico. Asegrese de hacerle al  mdico cualquier pregunta que tenga. Document Revised: 06/13/2020 Document Reviewed: 06/13/2020 Elsevier Patient Education  [place].

## 2021-11-13 LAB — CBC
Hematocrit: 36.4 % (ref 34.0–46.6)
Hemoglobin: 12.5 g/dL (ref 11.1–15.9)
MCH: 29.4 pg (ref 26.6–33.0)
MCHC: 34.3 g/dL (ref 31.5–35.7)
MCV: 86 fL (ref 79–97)
Platelets: 231 10*3/uL (ref 150–450)
RBC: 4.25 x10E6/uL (ref 3.77–5.28)
RDW: 13.5 % (ref 11.7–15.4)
WBC: 9.6 10*3/uL (ref 3.4–10.8)

## 2021-11-13 LAB — GLUCOSE TOLERANCE, 1 HOUR: Glucose, 1Hr PP: 124 mg/dL (ref 70–199)

## 2021-11-13 LAB — RPR: RPR Ser Ql: NONREACTIVE

## 2021-11-13 LAB — HIV ANTIBODY (ROUTINE TESTING W REFLEX): HIV Screen 4th Generation wRfx: NONREACTIVE

## 2021-11-13 LAB — VARICELLA ZOSTER ANTIBODY, IGG: Varicella zoster IgG: 357 index (ref >165)

## 2021-11-14 ENCOUNTER — Telehealth: Payer: Self-pay | Admitting: Family Medicine

## 2021-11-14 NOTE — Telephone Encounter (Signed)
Attempted to call patient with spanish interpreter Quillian Quince, [phone]) to inform of normal results (1hr glucola, CBC, HIV, RPR, varicella were all normal). No answer and unable to leave voicemail as mailbox is full. Tried alternate contact number as well, which is "no longer in service". Will forward to Dr. Larae Grooms to please inform her of normal results at follow-up appt on 12/13.

## 2021-11-26 ENCOUNTER — Ambulatory Visit (INDEPENDENT_AMBULATORY_CARE_PROVIDER_SITE_OTHER): Payer: Self-pay | Admitting: Family Medicine

## 2021-11-26 ENCOUNTER — Other Ambulatory Visit: Payer: Self-pay

## 2021-11-26 VITALS — BP 113/65 | HR 84 | Wt 237.5 lb

## 2021-11-26 DIAGNOSIS — Z3480 Encounter for supervision of other normal pregnancy, unspecified trimester: Secondary | ICD-10-CM

## 2021-11-26 NOTE — Progress Notes (Signed)
  [place] Prenatal Visit  Regina Cunningham is a 40 y.o. [id number] at [redacted]w[redacted]d here for routine follow up. She is dated by early ultrasound.  She reports no complaints.  She reports fetal movement. She denies vaginal bleeding, contractions, or loss of fluid.  See flow sheet for details.  Vitals:   11/26/21 0940  BP: 113/65  Pulse: 84     A/P: Pregnancy at [redacted]w[redacted]d.  Doing well.   Routine prenatal care:  Dating reviewed, dating tab is correct after changes have been made. Fetal heart tones: appropriate. 143 bpm Fundal height: appropriate. 32 cm The patient does not have a history of HSV and valacyclovir is not indicated at this time.  The patient does not have history of C-section. Infant feeding choice: Formula  Contraception choice: undecided Infant circumcision desired: no  Influenza vaccine which will be administered tomorrow at her appointment.  Tdap was not given today. Appointment scheduled for tomorrow. COVID vaccination was discussed and patient is vaccinated with 2 doses and booster.  Childbirth and education classes were not offered. Pregnancy education regarding benefits of breastfeeding, contraception, fetal growth, expected weight gain, and safe infant sleep were discussed.  Preterm labor and fetal movement precautions reviewed.   2. Pregnancy issues include the following and were addressed as appropriate today:  Reviewed and discussed prior lab results with patient. Reviewed dating and changes made appropriate as patient was agreeable. PHQ-9 score of 1 reviewed.   Problem list and pregnancy box updated: Yes.   Scheduled for [place] clinic in third trimester on 01/02/2022.   Follow up 2 weeks on 12/30.  In-person Spanish interpretation utilized throughout the entirety of this encounter.

## 2021-11-26 NOTE — Patient Instructions (Addendum)
¡  Fue genial verte hoy!  Me alegro de que te est yendo bien! Contine tomando su aspirina y vitamina prenatal diariamente. Hemos cambiado su fecha, por lo que su nueva fecha de parto es el 18/01/2022.  Haga un seguimiento en su prxima cita programada el 56/12 a las 9:30 am con el Dr. Rock Nephew, si surge algo entre ahora y [place], no dude en comunicarse con nuestra oficina.  Vaya a MAU en [place] en [place] si: ? Comienza a tener contracciones fuertes y frecuentes. ? Tu agua se rompe. A veces es un gran chorro de lquido, a veces es solo un goteo que sigue mojando tu ropa interior o corriendo por tus piernas. ? Tiene sangrado vaginal. Es normal tener una pequea cantidad de manchas si se revis el cuello uterino. ? No siente que su beb se mueva normalmente. Si no lo hace, busque algo de comer y beber, acustese y concntrese en sentir cmo se mueve su beb. Si su beb todava no se mueve con normalidad, debe ir a MAU.   Gracias por permitirnos ser parte de su atencin mdica!  Gracias, Dra. Larae Grooms  It was great seeing you today!  I am glad you are doing well! Please continue to take your aspirin and prenatal vitamin daily. We have changed your dating so your new due date is 02/01/2022.   Please follow up at your next scheduled appointment on 12/30 at 9:30 am with Dr. Rock Nephew, if anything arises between now and then, please don't hesitate to contact our office.  Go to the MAU at [place] at [facility] if: You begin to have strong, frequent contractions Your water breaks.  Sometimes it is a big gush of fluid, sometimes it is just a trickle that keeps getting your underwear wet or running down your legs You have vaginal bleeding.  It is normal to have a small amount of spotting if your cervix was checked.  You do not feel your baby moving like normal.  If you do not, get something to eat and drink and lay down and focus on feeling your baby move.   If  your baby is still not moving like normal, you should go to MAU.    Thank you for allowing [place] to be a part of your medical care!  Thank you, Dr. Larae Grooms

## 2021-12-13 ENCOUNTER — Ambulatory Visit (INDEPENDENT_AMBULATORY_CARE_PROVIDER_SITE_OTHER): Payer: Self-pay | Admitting: Family Medicine

## 2021-12-13 ENCOUNTER — Other Ambulatory Visit: Payer: Self-pay

## 2021-12-13 VITALS — BP 119/66 | HR 91 | Wt 239.8 lb

## 2021-12-13 DIAGNOSIS — Z3480 Encounter for supervision of other normal pregnancy, unspecified trimester: Secondary | ICD-10-CM

## 2021-12-13 DIAGNOSIS — O09529 Supervision of elderly multigravida, unspecified trimester: Secondary | ICD-10-CM | POA: Insufficient documentation

## 2021-12-13 NOTE — Patient Instructions (Addendum)
Signos y sntomas del trabajo de parto Signs and Symptoms of Labor [place] de [place] es el proceso natural del cuerpo para sacar al beb y a la placenta del tero. Por lo general, el proceso del trabajo de parto comienza cuando el embarazo ha llegado a su trmino, entre las semanas 68 y 62 del Media planner. Signos y sntomas de que est cerca de empezar el [place] de parto A medida que el cuerpo se prepara para el [place] de parto y el nacimiento del beb, puede notar los siguientes sntomas en las semanas y [place] anteriores al trabajo de parto propiamente dicho: Eliminacin de una pequea cantidad de mucosidad espesa y sanguinolenta por la vagina. A esto se lo llama aparicin normal de sangre o prdida del tapn mucoso. Esto puede suceder ms de una semana antes de que comience el [place] de [place], o justo antes de que comience el [place] de parto a medida que el cuello uterino comienza a ensancharse (dilatarse). En algunas mujeres, el tapn Walt Disney entero de una sola vez. En otras, pueden salir partes del tapn mucoso de forma gradual United Stationers. El beb se mueve (desciende) a la parte inferior de la pelvis para ponerse en posicin para el nacimiento (aligeramiento). Cuando esto sucede, puede sentir ms presin en la vejiga y el hueso plvico, y menos presin en las costillas. Esto facilitar la respiracin. Tambin puede hacer que necesite orinar con ms frecuencia y que tenga problemas para Public house manager. Tener contracciones de Location manager, tambin llamadas contracciones de [place], o trabajo de [place]. Estas se producen a intervalos irregulares (espaciadas de modo desigual) con una diferencia de ms de 10 minutos. Las contracciones de [place] de parto falso son frecuentes despus del ejercicio o la actividad sexual. Se detendrn si cambia de posicin, descansa o bebe lquidos. Estas contracciones son generalmente leves y no se tornan ms fuertes con el tiempo. Pueden sentirse  como lo siguiente: Un dolor de espalda. Calambres leves, similares a los [employer]Ex. Tirantez o presin en el abdomen. Otros sntomas tempranos pueden ser los siguientes: Nuseas o prdida del apetito. Diarrea. Una repentina explosin de energa o sentirse muy cansada. Cambios en el humor. Problemas para dormir. Signos y sntomas de que ha comenzado el trabajo de parto [place] signos de que est en trabajo de parto pueden incluir los siguientes: Contracciones a intervalos regulares (espaciadas de modo regular) que se incrementan en intensidad. Esto puede sentirse como presin o estrechamiento intenso en el abdomen, que se desplaza hacia la espalda. Las contracciones pueden sentirse tambin como dolor rtmico en la parte superior de los muslos y la espalda que va y viene a intervalos regulares. Si es la primera vez que da a Actuary, este cambio en la intensidad de las contracciones ocurre generalmente a un ritmo ms gradual. Si ya ha dado a luz antes, puede notar una progresin ms rpida de los cambios de las contracciones. Sensacin de presin en el rea vaginal. Ruptura de la bolsa (ruptura de las [place]). Se produce cuando el saco de lquido que rodea al beb se rompe. La prdida de lquido de la vagina puede ser transparente o estar teida de sangre. Generalmente el trabajo de parto comienza 24 horas despus de la ruptura de [place], [place] puede tomar ms Oceanographer. Algunas personas pueden sentir un chorro repentino de lquido; otras pueden observar ropa interior hmeda de forma repetida. Siga estas instrucciones en su casa:  Cuando comience el trabajo de parto o si rompe bolsa, llame  al mdico o a la lnea de atencin de enfermera. Ellos determinarn, en funcin de su situacin, cundo debe ir a Geophysical data processor. Durante el trabajo de parto temprano, es posible que pueda descansar y [employer] sntomas en su casa. Algunas estrategias para probar en su casa incluyen: Tcnicas  de respiracin y relajacin. Tomar una ducha o un bao de inmersin tibios. Escuchar msica. Usar una almohadilla trmica en la espalda para Best boy. Si se lo indican, aplique calor en la zona con la frecuencia que le haya indicado el mdico. Use la fuente de calor que el mdico le recomiende, como una compresa de calor hmedo o una almohadilla trmica. Coloque una toalla entre la piel y la fuente de Freight forwarder. Aplique calor durante 20 a 30 minutos. Retire la fuente de calor si la piel se pone de color rojo brillante. Esto es especialmente importante si no puede sentir dolor, calor o fro. Corre un mayor riesgo de sufrir quemaduras. Comunquese con un mdico si: Comenz el trabajo de [place]. Rompe la bolsa. Tiene nuseas, vmitos o diarrea. Solicite ayuda de inmediato si: Tiene contracciones dolorosas y regulares cada 5 minutos o menos. El trabajo de parto comienza antes de que se cumplan las 60 semanas de [place]. Tiene fiebre. Elimina cogulos de sangre de color rojo brillante por la vagina. No siente que el beb se mueva. Tiene dolor de cabeza intenso con o sin problemas de visin. Siente falta de aire o Tourist information centre manager. Estos sntomas pueden representar un problema grave que constituye Engineer, maintenance (IT). No espere a ver si los sntomas desaparecen. Solicite atencin mdica de inmediato. Comunquese con el servicio de emergencias de su localidad (911 en los [place]). No conduzca por sus propios medios Principal Financial. Resumen El trabajo de parto es el proceso natural del cuerpo por el cual se saca al beb y la placenta del tero. Por lo general, el proceso del trabajo de parto comienza cuando el embarazo ha llegado a su trmino, entre las semanas 45 y 1 de Media planner. Cuando comience el trabajo de parto o si rompe bolsa, llame al mdico o a la lnea de atencin de enfermera. Ellos determinarn, en funcin de su situacin, cundo debe ir a Geophysical data processor. Esta informacin no  tiene Marine scientist el consejo del mdico. Asegrese de hacerle al mdico cualquier pregunta que tenga. Document Revised: 05/07/2021 Document Reviewed: 05/07/2021 Elsevier Patient Education  [place].

## 2021-12-13 NOTE — Progress Notes (Signed)
°  [place] Prenatal Visit  Regina Cunningham is a 40 y.o. [id number] at [redacted]w[redacted]d here for routine follow up. She is dated by 17 week ultrasound.  She reports occasional contractions, which are not regular. She reports good fetal movement. She denies vaginal bleeding or loss of fluid.  See flow sheet for details.  Vitals:   12/13/21 0924  BP: 119/66  Pulse: 91    A/P: Pregnancy at [redacted]w[redacted]d.  Doing well.   Routine prenatal care:  Dating reviewed, dating tab is correct. Was initially dated by LMP-- changed based on [redacted]w[redacted]d ultrasound as LMP was not reliable (had IUD in 3 months prior to conception) Fetal heart tones: Appropriate 140bpm Fundal height: within expected range.  The patient does not have a history of HSV and valacyclovir is not indicated at this time.  The patient does not have a history of Cesarean delivery and no referral to [facility] [place] is indicated Infant feeding choice: Formula Contraception choice: Undecided - considering IUD Infant circumcision desired? no Influenza vaccine [facility] 11[facility]21 Tdap was given at the [facility] on 11/27/21 COVID vaccination was discussed and patient has received 2 doses + 1 booster.  Childbirth and education classes were not offered. Pregnancy education regarding benefits of breastfeeding, contraception, fetal growth, expected weight gain, and safe infant sleep were discussed.  Preterm labor and fetal movement precautions reviewed.   2. Pregnancy issues include the following and were addressed as appropriate today:  Language barrier- spanish interpreter used for duration of encounter  Advanced maternal age- patient declined genetic screening/referral  Problem list and pregnancy box updated: Yes.   Scheduled for [place] clinic in third trimester on 01/02/2022.   Follow up 2 weeks.   Alcus Dad, MD

## 2021-12-15 NOTE — L&D Delivery Note (Addendum)
° °  Delivery Note Ctx now q 2-3 minutes.  Trial of one push resulted in deep variable. Dr. Elonda Husky consulted and suggested pt push and hopefully baby would rotate and deliver.  NICU and Respiratory at bedside due to decelerations. Dr. Elonda Husky at bedside as well. Head delivered 2050. Nuchal  and body cord present. Shoulder and body delivered in usual fashion. At 2051 a viable female was delivered via Vaginal, Spontaneous (Presentation: ROA). Infant without spontaneous cry, placed on mother's abdomen, dried and stimulated. Cord clamped x 2 after 1-minute delay, and cut by father. Cord blood  and cord gas drawn. Placenta delivered spontaneously with gentle cord traction. Appears intact. Fundus firm with massage and Pitocin and TXA given. Labia, perineum, vagina, and cervix inspected with 4x4, intact.    APGAR:4 ,9  weight: pending see delivery summary.   Cord: 3VC with the following complications:None.   Cord pH: 7.17  Anesthesia:   Episiotomy: None Lacerations: None Est. Blood Loss (mL): 200  Mom to postpartum.  Baby Regina Cunningham to Couplet care / Skin to Skin.  Regina Cunningham, SNM 9:10 PM     The above was performed under my direct supervision and guidance.

## 2022-01-02 ENCOUNTER — Other Ambulatory Visit: Payer: Self-pay

## 2022-01-02 ENCOUNTER — Ambulatory Visit (INDEPENDENT_AMBULATORY_CARE_PROVIDER_SITE_OTHER): Payer: Self-pay | Admitting: Family Medicine

## 2022-01-02 VITALS — BP 123/59 | HR 87 | Wt 239.4 lb

## 2022-01-02 DIAGNOSIS — Z3480 Encounter for supervision of other normal pregnancy, unspecified trimester: Secondary | ICD-10-CM

## 2022-01-02 NOTE — Progress Notes (Signed)
[facility] Visit  Regina Cunningham is a 41 y.o. [id number] at [redacted]w[redacted]d (via 17 wk sono) who presents to [facility] for routine follow up. Seen today along with resident Dr. Joelyn Oms. Prenatal course, history, notes, ultrasounds, and laboratory results reviewed.  Denies cramping/ctx, fluid leaking, vaginal bleeding, or decreased fetal movement. Taking PNV and aspirin.    Primary Prenatal Care Provider: Dr Rock Nephew  Postpartum Plans: - delivery planning: SVD, has never had epidural and doesn't want one - circumcision: declines, having boy - feeding: bottle - pediatrician: undecided, [facility] - contraception: undecided, considering IUD as she has had before, counseled today  FHR: 141 bpm Uterine size: 37 cm  Assessment & Plan  1. Routine prenatal care: - Sono noted vertex presentation today - COVID booster offered and she declined, counseled today - Obstetrical history reviewed- first pregnancy was "two months early" but also had no prenatal care, other pregnancies at term without h/o PPH, shoulder dystocia, vaccuum, forceps, gHTN or GDM, and all infants were around 7 lbs  2. AMA- declined genetic screening.  Next prenatal visit in 1 weeks - needs GBS swab and GC/Chlamydia swabs then. Labor & fetal movement precautions discussed.  Yehuda Savannah, MD [place]

## 2022-01-02 NOTE — Patient Instructions (Signed)
Precauciones de devolucin relacionadas con Arnoldo Lenis  Los siguientes son signos/sntomas que son anormales en el Media planner y pueden requerir una evaluacin adicional por parte de un mdico: Vaya a MAU en [place] en [place] si: Tiene calambres/contracciones que no desaparecen con agua potable, especialmente si duran de 30 segundos a 1,5 minutos, aparecen y desaparecen cada 5-10 minutos durante una hora o ms, o si son cada vez ms fuertes y no puede caminar o Tax inspector. Tu agua se rompe. A veces es un gran chorro de lquido, a veces es solo un goteo que sigue mojando tu ropa interior o corriendo por tus piernas. Tiene sangrado vaginal. No siente que su beb se mueva normalmente. Si no lo hace, busque algo para comer y beber (algo fro o algo con azcar [employer] de man o [place]) y acustese y concntrese en sentir cmo se mueve su beb. Si su beb todava no se mueve con normalidad, debe ir a MAU. Debe sentir que su beb se mueve 6 veces en una hora o 10 veces en dos horas. Tiene un dolor de cabeza persistente que no desaparece con 1 g de Tylenol, cambios en la visin, dolor en el pecho, dificultad para respirar, dolor intenso en la parte superior derecha del abdomen, empeoramiento de la hinchazn de las piernas; todos estos pueden ser signos de presin arterial alta en el embarazo y necesita para ser evaluado por un proveedor inmediatamente  [employer] son preocupantes durante el Media planner y, si tiene alguno de [place], le recomiendo que llame a su PCP y se presente a Geneticist, molecular de Admisiones de Maternidad (mapa a continuacin) para Educational psychologist.  Para cualquier emergencia relacionada con Water quality scientist, dirjase a la [place] de Admisiones de Maternidad en [place] y [facility]. Usar la Entrada C del hospital.

## 2022-01-07 ENCOUNTER — Other Ambulatory Visit (HOSPITAL_COMMUNITY)
Admission: RE | Admit: 2022-01-07 | Discharge: 2022-01-07 | Disposition: A | Discharge status: Home / Self Care | Payer: Self-pay | Source: Ambulatory Visit | Attending: Family Medicine | Admitting: Family Medicine

## 2022-01-07 ENCOUNTER — Encounter: Payer: Self-pay | Admitting: Family Medicine

## 2022-01-07 ENCOUNTER — Other Ambulatory Visit: Payer: Self-pay

## 2022-01-07 ENCOUNTER — Ambulatory Visit (INDEPENDENT_AMBULATORY_CARE_PROVIDER_SITE_OTHER): Payer: Self-pay | Admitting: Family Medicine

## 2022-01-07 VITALS — BP 113/73 | HR 79 | Wt 241.4 lb

## 2022-01-07 DIAGNOSIS — Z113 Encounter for screening for infections with a predominantly sexual mode of transmission: Secondary | ICD-10-CM

## 2022-01-07 DIAGNOSIS — Z3480 Encounter for supervision of other normal pregnancy, unspecified trimester: Secondary | ICD-10-CM

## 2022-01-07 DIAGNOSIS — Z3A36 36 weeks gestation of pregnancy: Secondary | ICD-10-CM

## 2022-01-07 NOTE — Patient Instructions (Addendum)
¡  Fue genial verte hoy!  Me alegro de que te est yendo bien!  Vaya a MAU en [place] en [place] si: ? Comienza a tener contracciones fuertes y frecuentes. ? Tu agua se rompe. A veces es un gran chorro de lquido, a veces es solo un goteo que sigue mojando tu ropa interior o corriendo por tus piernas. ? Tiene sangrado vaginal. Es normal tener una pequea cantidad de manchas si se revis el cuello uterino. ? No siente que su beb se mueva normalmente. Si no lo hace, busque algo de comer y beber, acustese y concntrese en sentir cmo se mueve su beb. Si su beb todava no se mueve con normalidad, debe ir a MAU.  Haga un seguimiento en su prxima cita programada, si surge algo entre ahora y [place], no dude en comunicarse con nuestra oficina.  Gracias por permitirnos ser parte de su atencin mdica!  Gracias, Dra. Larae Grooms  It was great seeing you today!  I am glad you are doing well!  Go to the MAU at [place] at [facility] if: You begin to have strong, frequent contractions Your water breaks.  Sometimes it is a big gush of fluid, sometimes it is just a trickle that keeps getting your underwear wet or running down your legs You have vaginal bleeding.  It is normal to have a small amount of spotting if your cervix was checked.  You do not feel your baby moving like normal.  If you do not, get something to eat and drink and lay down and focus on feeling your baby move.   If your baby is still not moving like normal, you should go to MAU.   Please follow up at your next scheduled appointment in 1 week, if anything arises between now and then, please don't hesitate to contact our office.  Thank you for allowing [place] to be a part of your medical care!  Thank you, Dr. Larae Grooms

## 2022-01-07 NOTE — Progress Notes (Signed)
°  [place] Prenatal Visit  Regina Cunningham is a 41 y.o. [phone] at [redacted]w[redacted]d here for routine follow up. She is dated by midtrimester ultrasound.  She reports no complaints. She reports fetal movement. She denies vaginal bleeding, contractions, or loss of fluid. See flow sheet for details.  Vitals:   01/07/22 0848  BP: 113/73  Pulse: 79    A/P: Pregnancy at [redacted]w[redacted]d.  Doing well.   Routine prenatal care  Dating reviewed, dating tab is correct. Fetal heart tones appropriate.  Fundal height appropriate. Fetal position confirmed vertex using ultrasound.  GBS collected today. Repeat GC/CT collected today. The patient does not have a history of HSV and valacyclovir is not indicated at this time.  Infant feeding choice: formula feeding Contraception choice: IUD Infant circumcision desired no Influenza vaccine previously administered Tdap previously administered.  COVID vaccination was discussed and patient politely declines. Instructed to let us know if she would like it in the near future.  Pregnancy education regarding preterm labor, fetal movement,  benefits of breastfeeding, contraception, fetal growth, expected weight gain, and safe infant sleep were discussed.    2. Pregnancy issues include the following and were addressed as appropriate today:   MAU precautions thoroughly discussed and provided. PHQ-9 score of 0 reviewed.   Problem list and pregnancy box updated: Yes.   Follow up 1 week.   In-person Spanish interpretation Aundria Mems) utilized throughout the entirety of this encounter.

## 2022-01-08 LAB — CERVICOVAGINAL ANCILLARY ONLY
Chlamydia: NEGATIVE
Comment: NEGATIVE
Comment: NORMAL
Neisseria Gonorrhea: NEGATIVE

## 2022-01-09 ENCOUNTER — Encounter: Payer: Self-pay | Admitting: Family Medicine

## 2022-01-11 LAB — CULTURE, BETA STREP (GROUP B ONLY): Strep Gp B Culture: NEGATIVE

## 2022-01-15 ENCOUNTER — Ambulatory Visit: Payer: Self-pay | Admitting: Student

## 2022-01-15 NOTE — Progress Notes (Signed)
°  [place] Prenatal Visit  Regina Cunningham is a 41 y.o. [id number] at [redacted]w[redacted]d here for routine follow up. She is dated by midtrimester (17wk) ultrasound. . She reports fetal movement. She denies vaginal bleeding, or loss of fluid. States she has contractions 2-3x/day, lasting 2 min at the most, they are usually in the morning. States these have been present for the last 3 weeks. She does endorse back pain with movement that is not painful enough to require medications. Overall, she does not have concerns with how she is currently feeling.  Vitals:   01/16/22 1350  BP: 102/64  Pulse: 88    A/P: Pregnancy at [redacted]w[redacted]d.  Doing well.   Routine prenatal care:  Dating reviewed, dating tab is correct Fetal heart tones Appropriate Fundal height within expected range.  Fetal position confirmed Vertex using Ultrasound .  Infant feeding choice: Formula feeding  Contraception choice: IUD Infant circumcision desired no Pain control in labor discussed and patient will consider epidural at a later time.  Influenza vaccine previously administered.   Tdap previously administered between 27-36 weeks  GBS and gc/chlamydia testing results were reviewed today.   Pregnancy education regarding labor, fetal movement, contraception, and safe infant sleep were discussed.  Labor and fetal movement precautions reviewed.  2. Pregnancy issues include the following and were addressed as appropriate today:   MAU precautions discussed. Advised should contractions become more consistent or she experience vaginal bleeding or leakage, proceed to MAU.   Problem list and pregnancy box updated: Yes.   Follow up 1 week.    In person Spanish interpreter used throughout encounter.

## 2022-01-16 ENCOUNTER — Other Ambulatory Visit: Payer: Self-pay

## 2022-01-16 ENCOUNTER — Ambulatory Visit (INDEPENDENT_AMBULATORY_CARE_PROVIDER_SITE_OTHER): Payer: Medicaid Other | Admitting: Student

## 2022-01-16 VITALS — BP 102/64 | HR 88 | Wt 244.2 lb

## 2022-01-16 DIAGNOSIS — O09523 Supervision of elderly multigravida, third trimester: Secondary | ICD-10-CM | POA: Diagnosis not present

## 2022-01-16 DIAGNOSIS — Z3A37 37 weeks gestation of pregnancy: Secondary | ICD-10-CM | POA: Diagnosis not present

## 2022-01-16 DIAGNOSIS — Z3480 Encounter for supervision of other normal pregnancy, unspecified trimester: Secondary | ICD-10-CM | POA: Diagnosis not present

## 2022-01-16 NOTE — Patient Instructions (Addendum)
It was great to see you today! Thank you for choosing [facility] for your primary care. Regina Cunningham was seen for your OB.  Today we addressed: I am not concerned at this time about your seldom contractions. Please note that if your contractions become more frequent and long lasting, or you experience fluid leakage or bleeding, please call the clinic or proceed to the maternity assessment unit for evaluation.  You should return to our clinic Return in about 1 week (around 01/23/2022).  Please arrive 15 minutes before your appointment to ensure smooth check in process.  We appreciate your efforts in making this happen.  Take care and seek immediate care sooner if you develop any concerns.   Thank you for allowing me to participate in your care, Wells Guiles, DO 01/16/2022, 2:26 PM PGY-1

## 2022-01-20 ENCOUNTER — Encounter (HOSPITAL_COMMUNITY): Payer: Self-pay | Admitting: Obstetrics & Gynecology

## 2022-01-20 ENCOUNTER — Inpatient Hospital Stay (HOSPITAL_COMMUNITY): Payer: Medicaid Other | Admitting: Anesthesiology

## 2022-01-20 ENCOUNTER — Inpatient Hospital Stay (HOSPITAL_COMMUNITY)
Admission: AD | Admit: 2022-01-20 | Discharge: 2022-01-22 | DRG: 807 | Disposition: A | Discharge status: Home / Self Care | Payer: Medicaid Other | Attending: Obstetrics & Gynecology | Admitting: Obstetrics & Gynecology

## 2022-01-20 ENCOUNTER — Other Ambulatory Visit: Payer: Self-pay

## 2022-01-20 DIAGNOSIS — Z3A38 38 weeks gestation of pregnancy: Secondary | ICD-10-CM

## 2022-01-20 DIAGNOSIS — Z7982 Long term (current) use of aspirin: Secondary | ICD-10-CM | POA: Diagnosis not present

## 2022-01-20 DIAGNOSIS — O26893 Other specified pregnancy related conditions, third trimester: Principal | ICD-10-CM | POA: Diagnosis present

## 2022-01-20 DIAGNOSIS — O4202 Full-term premature rupture of membranes, onset of labor within 24 hours of rupture: Secondary | ICD-10-CM | POA: Diagnosis not present

## 2022-01-20 DIAGNOSIS — Z3480 Encounter for supervision of other normal pregnancy, unspecified trimester: Secondary | ICD-10-CM

## 2022-01-20 DIAGNOSIS — Z20822 Contact with and (suspected) exposure to covid-19: Secondary | ICD-10-CM | POA: Diagnosis present

## 2022-01-20 DIAGNOSIS — O99214 Obesity complicating childbirth: Secondary | ICD-10-CM | POA: Diagnosis present

## 2022-01-20 DIAGNOSIS — O09529 Supervision of elderly multigravida, unspecified trimester: Secondary | ICD-10-CM

## 2022-01-20 LAB — TYPE AND SCREEN
ABO/RH(D): O POS
Antibody Screen: NEGATIVE

## 2022-01-20 LAB — CBC
HCT: 40.5 % (ref 36.0–46.0)
Hemoglobin: 13.6 g/dL (ref 12.0–15.0)
MCH: 28.9 pg (ref 26.0–34.0)
MCHC: 33.6 g/dL (ref 30.0–36.0)
MCV: 86.2 fL (ref 80.0–100.0)
Platelets: 203 10*3/uL (ref 150–400)
RBC: 4.7 MIL/uL (ref 3.87–5.11)
RDW: 15.1 % (ref 11.5–15.5)
WBC: 9.8 10*3/uL (ref 4.0–10.5)
nRBC: 0 % (ref 0.0–0.2)

## 2022-01-20 LAB — RESP PANEL BY RT-PCR (FLU A&B, COVID) ARPGX2
Influenza A by PCR: NEGATIVE
Influenza B by PCR: NEGATIVE
SARS Coronavirus 2 by RT PCR: NEGATIVE

## 2022-01-20 LAB — RPR: RPR Ser Ql: NONREACTIVE

## 2022-01-20 MED ORDER — TRANEXAMIC ACID-NACL 1000-0.7 MG/100ML-% IV SOLN
INTRAVENOUS | Status: AC
  Filled 2022-01-20: qty 100

## 2022-01-20 MED ORDER — SOD CITRATE-CITRIC ACID 500-334 MG/5ML PO SOLN: 30.0000 mL | ORAL | Status: DC | PRN

## 2022-01-20 MED ORDER — FENTANYL-BUPIVACAINE-NACL 0.5-0.125-0.9 MG/250ML-% EP SOLN
12.0000 mL/h | EPIDURAL | Status: DC | PRN
  Administered 2022-01-20: 12 mL/h via EPIDURAL
  Filled 2022-01-20: qty 250

## 2022-01-20 MED ORDER — LACTATED RINGERS AMNIOINFUSION: INTRAVENOUS | Status: DC

## 2022-01-20 MED ORDER — WITCH HAZEL-GLYCERIN EX PADS: 1.0000 "application " | MEDICATED_PAD | CUTANEOUS | Status: DC | PRN

## 2022-01-20 MED ORDER — OXYCODONE-ACETAMINOPHEN 5-325 MG PO TABS: 1.0000 | ORAL_TABLET | ORAL | Status: DC | PRN

## 2022-01-20 MED ORDER — MEDROXYPROGESTERONE ACETATE 150 MG/ML IM SUSP: 150.0000 mg | INTRAMUSCULAR | Status: DC | PRN

## 2022-01-20 MED ORDER — BENZOCAINE-MENTHOL 20-0.5 % EX AERO: 1.0000 "application " | INHALATION_SPRAY | CUTANEOUS | Status: DC | PRN

## 2022-01-20 MED ORDER — METHYLERGONOVINE MALEATE 0.2 MG/ML IJ SOLN: 0.2000 mg | INTRAMUSCULAR | Status: DC | PRN

## 2022-01-20 MED ORDER — ONDANSETRON HCL 4 MG PO TABS: 4.0000 mg | ORAL_TABLET | ORAL | Status: DC | PRN

## 2022-01-20 MED ORDER — PRENATAL MULTIVITAMIN CH
1.0000 | ORAL_TABLET | Freq: Every day | ORAL | Status: DC
  Administered 2022-01-21 – 2022-01-22 (×2): 1 via ORAL
  Filled 2022-01-20 (×2): qty 1

## 2022-01-20 MED ORDER — TETANUS-DIPHTH-ACELL PERTUSSIS 5-2.5-18.5 LF-MCG/0.5 IM SUSY: 0.5000 mL | PREFILLED_SYRINGE | Freq: Once | INTRAMUSCULAR | Status: DC

## 2022-01-20 MED ORDER — ONDANSETRON HCL 4 MG/2ML IJ SOLN: 4.0000 mg | INTRAMUSCULAR | Status: DC | PRN

## 2022-01-20 MED ORDER — SIMETHICONE 80 MG PO CHEW: 80.0000 mg | CHEWABLE_TABLET | ORAL | Status: DC | PRN

## 2022-01-20 MED ORDER — LACTATED RINGERS IV SOLN
500.0000 mL | Freq: Once | INTRAVENOUS | Status: AC
  Administered 2022-01-20: 500 mL via INTRAVENOUS

## 2022-01-20 MED ORDER — EPHEDRINE 5 MG/ML INJ: 10.0000 mg | INTRAVENOUS | Status: DC | PRN

## 2022-01-20 MED ORDER — OXYTOCIN BOLUS FROM INFUSION
333.0000 mL | Freq: Once | INTRAVENOUS | Status: AC
  Administered 2022-01-20: 333 mL via INTRAVENOUS

## 2022-01-20 MED ORDER — OXYCODONE-ACETAMINOPHEN 5-325 MG PO TABS: 2.0000 | ORAL_TABLET | ORAL | Status: DC | PRN

## 2022-01-20 MED ORDER — LACTATED RINGERS IV SOLN: INTRAVENOUS | Status: DC

## 2022-01-20 MED ORDER — COCONUT OIL OIL: 1.0000 "application " | TOPICAL_OIL | Status: DC | PRN

## 2022-01-20 MED ORDER — PHENYLEPHRINE 40 MCG/ML (10ML) SYRINGE FOR IV PUSH (FOR BLOOD PRESSURE SUPPORT): 80.0000 ug | PREFILLED_SYRINGE | INTRAVENOUS | Status: DC | PRN

## 2022-01-20 MED ORDER — DIPHENHYDRAMINE HCL 25 MG PO CAPS: 25.0000 mg | ORAL_CAPSULE | Freq: Four times a day (QID) | ORAL | Status: DC | PRN

## 2022-01-20 MED ORDER — OXYTOCIN-SODIUM CHLORIDE 30-0.9 UT/500ML-% IV SOLN
1.0000 m[IU]/min | INTRAVENOUS | Status: DC
  Administered 2022-01-20 (×2): 2 m[IU]/min via INTRAVENOUS

## 2022-01-20 MED ORDER — TRANEXAMIC ACID-NACL 1000-0.7 MG/100ML-% IV SOLN
1000.0000 mg | Freq: Once | INTRAVENOUS | Status: AC
  Administered 2022-01-20: 1000 mg via INTRAVENOUS

## 2022-01-20 MED ORDER — BISACODYL 10 MG RE SUPP: 10.0000 mg | Freq: Every day | RECTAL | Status: DC | PRN

## 2022-01-20 MED ORDER — LACTATED RINGERS IV SOLN
500.0000 mL | INTRAVENOUS | Status: DC | PRN
  Administered 2022-01-20: 500 mL via INTRAVENOUS

## 2022-01-20 MED ORDER — DIBUCAINE (PERIANAL) 1 % EX OINT: 1.0000 "application " | TOPICAL_OINTMENT | CUTANEOUS | Status: DC | PRN

## 2022-01-20 MED ORDER — DOCUSATE SODIUM 100 MG PO CAPS
100.0000 mg | ORAL_CAPSULE | Freq: Two times a day (BID) | ORAL | Status: DC
  Administered 2022-01-20 – 2022-01-22 (×4): 100 mg via ORAL
  Filled 2022-01-20 (×4): qty 1

## 2022-01-20 MED ORDER — LIDOCAINE-EPINEPHRINE (PF) 2 %-1:200000 IJ SOLN
INTRAMUSCULAR | Status: DC | PRN
  Administered 2022-01-20: 5 mL via EPIDURAL

## 2022-01-20 MED ORDER — IBUPROFEN 600 MG PO TABS
600.0000 mg | ORAL_TABLET | Freq: Four times a day (QID) | ORAL | Status: DC
  Administered 2022-01-20 – 2022-01-22 (×7): 600 mg via ORAL
  Filled 2022-01-20 (×7): qty 1

## 2022-01-20 MED ORDER — TERBUTALINE SULFATE 1 MG/ML IJ SOLN: 0.2500 mg | Freq: Once | INTRAMUSCULAR | Status: DC | PRN

## 2022-01-20 MED ORDER — METHYLERGONOVINE MALEATE 0.2 MG PO TABS: 0.2000 mg | ORAL_TABLET | ORAL | Status: DC | PRN

## 2022-01-20 MED ORDER — ONDANSETRON HCL 4 MG/2ML IJ SOLN: 4.0000 mg | Freq: Four times a day (QID) | INTRAMUSCULAR | Status: DC | PRN

## 2022-01-20 MED ORDER — ACETAMINOPHEN 325 MG PO TABS
650.0000 mg | ORAL_TABLET | ORAL | Status: DC | PRN
  Administered 2022-01-21 (×2): 650 mg via ORAL
  Filled 2022-01-20 (×2): qty 2

## 2022-01-20 MED ORDER — LIDOCAINE HCL (PF) 1 % IJ SOLN: 30.0000 mL | INTRAMUSCULAR | Status: DC | PRN

## 2022-01-20 MED ORDER — PHENYLEPHRINE 40 MCG/ML (10ML) SYRINGE FOR IV PUSH (FOR BLOOD PRESSURE SUPPORT)
80.0000 ug | PREFILLED_SYRINGE | INTRAVENOUS | Status: DC | PRN
  Filled 2022-01-20: qty 10

## 2022-01-20 MED ORDER — ACETAMINOPHEN 325 MG PO TABS
650.0000 mg | ORAL_TABLET | ORAL | Status: DC | PRN
  Administered 2022-01-20: 650 mg via ORAL
  Filled 2022-01-20: qty 2

## 2022-01-20 MED ORDER — MEASLES, MUMPS & RUBELLA VAC IJ SOLR: 0.5000 mL | Freq: Once | INTRAMUSCULAR | Status: DC

## 2022-01-20 MED ORDER — FLEET ENEMA 7-19 GM/118ML RE ENEM: 1.0000 | ENEMA | Freq: Every day | RECTAL | Status: DC | PRN

## 2022-01-20 MED ORDER — FERROUS SULFATE 325 (65 FE) MG PO TABS
325.0000 mg | ORAL_TABLET | ORAL | Status: DC
  Administered 2022-01-21: 325 mg via ORAL
  Filled 2022-01-20: qty 1

## 2022-01-20 MED ORDER — DIPHENHYDRAMINE HCL 50 MG/ML IJ SOLN: 12.5000 mg | INTRAMUSCULAR | Status: DC | PRN

## 2022-01-20 MED ORDER — OXYTOCIN-SODIUM CHLORIDE 30-0.9 UT/500ML-% IV SOLN
2.5000 [IU]/h | INTRAVENOUS | Status: DC
  Administered 2022-01-20: 2.5 [IU]/h via INTRAVENOUS
  Filled 2022-01-20: qty 500

## 2022-01-20 NOTE — Progress Notes (Signed)
Regina Cunningham is a 41 y.o. [id number] at [redacted]w[redacted]d admitted for SROM at 0400 this morning.  Subjective: Denies perineal pressure, pain.   Objective: BP (!) 97/56    Pulse 79    Temp 98.7 F (37.1 C) (Oral)    Resp 16    LMP 04/18/2021 (Exact Date)    SpO2 99%  No intake/output data recorded. Total I/O In: -  Out: 700 [Urine:700]  FHT:  FHR: 135 bpm, variability: moderate,  accelerations:  Present,  decelerations:  Absent UC:   irregular, every 2-5 minutes SVE:   Dilation: Lip/rim Effacement (%): 100 Station: -1 Exam by:: [employer] RN  Labs: Lab Results  Component Value Date   WBC 9.8 01/20/2022   HGB 13.6 01/20/2022   HCT 40.5 01/20/2022   MCV 86.2 01/20/2022   PLT 203 01/20/2022    Assessment / Plan: --CNM at bedside to assess readiness for pushing, pvery thin reducible anterior lip, practice push x 2 --Great maternal effort, no fetal movement --Prolonged deceleration to 80s after second push, good variability --Dr. Dione Plover notified, present at bedside a short time later --Pitocin discontinued, patient repositioned to hands and knees, slow recovery to baseline --Will baby has best tracing in right lateral, will attempt to implement position changes which maintain right lateral as dominant field --Plan to restart Pitocin at 1730 if reassuring tracing --Anticipate vaginal birth --Language barrier: Electrical engineer present for entire encounter at [place], [place] 01/20/2022, 4:58 PM

## 2022-01-20 NOTE — Discharge Summary (Signed)
Postpartum Discharge Summary  Date of Service updated2/8/23     Patient Name: Regina Cunningham DOB: Jun 25, 1981 MRN: [id number]  Date of admission: 01/20/2022 Delivery date:01/20/2022  Delivering provider: WHITE, Ubaldo Glassing D  Date of discharge: 01/22/2022  Admitting diagnosis: Supervision of other normal pregnancy, antepartum [Z34.80] Intrauterine pregnancy: [redacted]w[redacted]d    Secondary diagnosis:  Principal Problem:   Supervision of other normal pregnancy, antepartum Active Problems:   Advanced maternal age in multigravida  Additional problems: none    Discharge diagnosis: Term Pregnancy Delivered                                              Post partum procedures: none Augmentation: Pitocin Complications: None  Hospital course: Onset of Labor With Vaginal Delivery      41y.o. yo [id number] 39w2das admitted in Active Labor on 01/20/2022. Patient had an uncomplicated labor course as follows:  Membrane Rupture Time/Date: 4:00 AM ,01/20/2022   Delivery Method:Vaginal, Spontaneous  Episiotomy: None  Lacerations:  None  Patient had an uncomplicated postpartum course.  She is ambulating, tolerating a regular diet, passing flatus, and urinating well. Patient is discharged home in stable condition on 01/22/22.  Newborn Data: Birth date:01/20/2022  Birth time:8:51 PM  Gender:Female  Living status:Living  Apgars:4 ,9  Weight:3490 g   Magnesium Sulfate received: No BMZ received: No Rhophylac:N/A MMR:N/A T-DaP:Given prenatally Flu: Yes Transfusion:No  Physical exam  Vitals:   01/21/22 0800 01/21/22 1230 01/21/22 2100 01/22/22 0525  BP: 105/64 105/64 112/78 125/78  Pulse: 75 72 70 72  Resp: 18 20 20 18   Temp: 98.5 F (36.9 C) 99.1 F (37.3 C) 98.5 F (36.9 C) 98.3 F (36.8 C)  TempSrc: Oral Oral Oral Oral  SpO2: 99% 98%  100%   General: alert, cooperative, and no distress Lochia: appropriate Uterine Fundus: firm Incision: N/A DVT Evaluation: No evidence of DVT seen on physical  exam. Negative Homan's sign. No cords or calf tenderness. No significant calf/ankle edema. Labs: Lab Results  Component Value Date   WBC 9.8 01/20/2022   HGB 13.6 01/20/2022   HCT 40.5 01/20/2022   MCV 86.2 01/20/2022   PLT 203 01/20/2022   CMP Latest Ref Rng & Units 12/03/2018  Glucose 70 - 99 mg/dL 101(H)  BUN 6 - 20 mg/dL 11  Creatinine 0.44 - 1.00 mg/dL 0.60  Sodium 135 - 145 mmol/L 139  Potassium 3.5 - 5.1 mmol/L 4.5  Chloride 98 - 111 mmol/L 102   Edinburgh Score: Edinburgh Postnatal Depression Scale Screening Tool 01/21/2022  I have been able to laugh and see the funny side of things. 3  I have looked forward with enjoyment to things. 1  I have blamed myself unnecessarily when things went wrong. 0  I have been anxious or worried for no good reason. 0  I have felt scared or panicky for no good reason. 1  Things have been getting on top of me. 1  I have been so unhappy that I have had difficulty sleeping. 0  I have felt sad or miserable. 1  I have been so unhappy that I have been crying. 0  The thought of harming myself has occurred to me. 0  Edinburgh Postnatal Depression Scale Total 7     After visit meds:  Allergies as of 01/22/2022   No Known Allergies  Medication List     STOP taking these medications    aspirin EC 81 MG tablet       TAKE these medications    ibuprofen 600 MG tablet Commonly known as: ADVIL Take 1 tablet (600 mg total) by mouth every 6 (six) hours as needed for mild pain, moderate pain or cramping.   multivitamin-prenatal 27-0.8 MG Tabs tablet Take 1 tablet by mouth daily at 12 noon.         Discharge home in stable condition Infant Feeding: Bottle Infant Disposition: on bili lights Discharge instruction: per After Visit Summary and Postpartum booklet. Activity: Advance as tolerated. Pelvic rest for 6 weeks.  Diet: routine diet Future Appointments: Future Appointments  Date Time Provider [place]   01/24/2022  3:05 PM Wells Guiles, DO [facility] [place]   Follow up Visit:  [place]. Call.   Why: 4-6 weeks for your postpartum visit Contact information: [place] [phone]                 Please schedule this patient for a In person postpartum visit in 4 weeks with the following provider: Any provider. Additional Postpartum F/U:  Low risk pregnancy complicated by:  Delivery mode:  Vaginal, Spontaneous  Anticipated Birth Control:   plans outpt IUD   01/22/2022 Roma Schanz, CNM

## 2022-01-20 NOTE — Progress Notes (Addendum)
Regina Cunningham is a 41 y.o. [id number] at [redacted]w[redacted]d   Subjective: Denies pressure, urge to push. Denies questions or concerns  Objective: BP 117/73    Pulse 81    Temp 98.5 F (36.9 C) (Oral)    Resp 16    LMP 04/18/2021 (Exact Date)    SpO2 99%  No intake/output data recorded. Total I/O In: -  Out: 700 [Urine:700]  FHT:  FHR: 130 bpm, variability: moderate,  accelerations:  Present,  decelerations:  Present Variable UC:   irregular, every 2-5 minutes SVE:   Dilation: Lip/rim Effacement (%): 100 Station: -1 Exam by:: [employer] RN  Labs: Lab Results  Component Value Date   WBC 9.8 01/20/2022   HGB 13.6 01/20/2022   HCT 40.5 01/20/2022   MCV 86.2 01/20/2022   PLT 203 01/20/2022    Assessment / Plan: --41 y.o. G5P3104 at [redacted]w[redacted]d  --GBS neg --Cat II tracing, ongoing amnioinfusion, reassuring variability --Not adequate by MVUs but making cervical change --CNM at bedside to assume care of patient --Currently tailor sitting, will reposition to right flying cowgirl --Plan to reassess cervix at 1615, will perform practice push if complete --Epidural in place and effective --Anticipate vaginal birth  Language barrier: remote interpreter services utilized for all patient interaction  Darlina Rumpf, [place] 01/20/2022, 4:00 PM

## 2022-01-20 NOTE — Progress Notes (Signed)
Epidural placed at 6am. Upon my assessment when starting my shift at 1100, urinary catheter not in place. Foley catheter placed and draining at 1152.

## 2022-01-20 NOTE — H&P (Signed)
OBSTETRIC ADMISSION HISTORY AND PHYSICAL  Regina Cunningham is a 41 y.o. female [phone] with IUP at [redacted]w[redacted]d by LMP  presenting for SROM. She reports +FMs, No LOF, no VB, no blurry vision, headaches or peripheral edema, and RUQ pain.  She plans on formula feeding. She is unsure about birth control. She received her prenatal care at [facility]   Dating: By LMP --->  Estimated Date of Delivery: 02/01/22  Sono:  @[redacted]w[redacted]d , CWD, normal anatomy, breech presentation, posterior lie, 475g, 64.3% EFW   Prenatal History/Complications:  Advanced maternal age  Past Medical History: History reviewed. No pertinent past medical history.  Past Surgical History: No past surgical history on file.  Obstetrical History: OB History     Gravida  5   Para  4   Term  3   Preterm  1   AB  0   Living  4      SAB      IAB      Ectopic      Multiple      Live Births  4           Social History Social History   Socioeconomic History   Marital status: Married    Spouse name: Not on file   Number of children: Not on file   Years of education: Not on file   Highest education level: Not on file  Occupational History   Not on file  Tobacco Use   Smoking status: Never   Smokeless tobacco: Never  Substance and Sexual Activity   Alcohol use: Never   Drug use: Never   Sexual activity: Not on file  Other Topics Concern   Not on file  Social History Narrative   Not on file   Social Determinants of Health   Financial Resource Strain: Not on file  Food Insecurity: Not on file  Transportation Needs: Not on file  Physical Activity: Not on file  Stress: Not on file  Social Connections: Not on file    Family History: No family history on file.  Allergies: No Known Allergies  Medications Prior to Admission  Medication Sig Dispense Refill Last Dose   aspirin EC 81 MG tablet Take 81 mg by mouth daily. Swallow whole.   01/19/2022   Prenatal Vit-Fe Fumarate-FA (MULTIVITAMIN-PRENATAL) 27-0.8 MG  TABS tablet Take 1 tablet by mouth daily at 12 noon.   01/19/2022     Review of Systems   All systems reviewed and negative except as stated in HPI  Blood pressure 127/78, pulse (!) 108, temperature 99.1 F (37.3 C), temperature source Oral, resp. rate 19, last menstrual period 04/18/2021, SpO2 99 %, unknown if currently breastfeeding. General appearance: alert Lungs: clear to auscultation bilaterally Heart: regular rate and rhythm Abdomen: soft, non-tender; bowel sounds normal Extremities: Homans sign is negative, no sign of DVT Presentation: cephalic Fetal monitoringBaseline: 125 bpm, Variability: Good {> 6 bpm), Accelerations: Reactive, and Decelerations: intermittent decels with contractions Uterine activity every 1-2 min Dilation: 4.5 Effacement (%): 80 Station: -3 Exam by:: Clotilde Dieter RN   Prenatal labs: ABO, Rh: O/Positive/-- (09/02 5465) Antibody: Negative (09/02 0938) Rubella: 9.97 (09/02 0938) RPR: Non Reactive (11/29 1030)  HBsAg: Negative (09/02 6812)  HIV: Non Reactive (11/29 1030)  GBS: Negative/-- (01/24 0924)  1 hr Glucola normal Genetic screening  declined Anatomy US normal  Prenatal Transfer Tool  Maternal Diabetes: No Genetic Screening: Declined Maternal Ultrasounds/Referrals: Normal Fetal Ultrasounds or other Referrals:  None Maternal Substance Abuse:  No Significant  Maternal Medications:  None Significant Maternal Lab Results: Group B Strep negative  No results found for this or any previous visit (from the past 24 hour(s)).  Patient Active Problem List   Diagnosis Date Noted   Advanced maternal age in multigravida 12/13/2021   Supervision of other normal pregnancy, antepartum 08/24/2021    Assessment/Plan:  Regina Cunningham is a 41 y.o. [id number] at [redacted]w[redacted]d here for SROM at 0400 on 2/6 and in early labor  #Labor: SROMed and making cervical change, plan for expectant management #Pain: Epidural #FWB: Cat I #ID:  GBS neg #MOF: formula #MOC:considering  outpatient IUD #Circ:  No    Renard Matter, MD  01/20/2022, 5:36 AM

## 2022-01-20 NOTE — Anesthesia Procedure Notes (Signed)

## 2022-01-20 NOTE — MAU Note (Addendum)
Pt reports to MAU with c/o ROM around 0400.  Fluid observed to be meconium stained.  Pt reports just now starting to have contractions.  +FM

## 2022-01-20 NOTE — Progress Notes (Signed)
Labor Progress Note Regina Cunningham is a 41 y.o. [id number] at [redacted]w[redacted]d presented for SROM after an uncomplicated pregnancy.  S:  Called to room by RN for recurrent deep variables with contractions. Video interpreter present via [employer] Interpretation services for consent, during the procedure and after for questions.  O:  BP 128/66    Pulse 96    Temp 99.1 F (37.3 C) (Oral)    Resp 16    LMP 04/18/2021 (Exact Date)    SpO2 99%  EFM: baseline 140 bpm/ moderate variability/ 15x15 accels/ deep variable decel with every contraction Toco/IUPC: q32min, IUPC placed SVE: Dilation: 7 Effacement (%): 90 Cervical Position: Posterior Station: -3 Presentation: Vertex Exam by:: Dr. Cy Blamer Pitocin: None, being expectantly managed although cervix unchanged  A/P: 41 y.o. [id number] [redacted]w[redacted]d  1. Labor: active, expectant management 2. FWB: Cat 2, improving after interventions  - IUPC/FSE placed - amnioinfusion started with 346ml bolus and 147ml/hr ongoing 3. Pain: well-controlled with epidural 4. All questions answered, pt comfortable with interventions and continuing expectant management. May consider Pitocin titration if needed, but will continue with amnio and frequent position changes.  Anticipate SVD.  Gabriel Carina, CNM 10:18 AM

## 2022-01-20 NOTE — Anesthesia Preprocedure Evaluation (Addendum)
Anesthesia Evaluation  Patient identified by MRN, date of birth, ID band Patient awake    Reviewed: Allergy & Precautions, NPO status , Patient's Chart, lab work & pertinent test results  Airway Mallampati: II  TM Distance: >3 FB Neck ROM: Full    Dental no notable dental hx.    Pulmonary neg pulmonary ROS,    Pulmonary exam normal breath sounds clear to auscultation       Cardiovascular negative cardio ROS Normal cardiovascular exam Rhythm:Regular Rate:Normal     Neuro/Psych negative neurological ROS  negative psych ROS   GI/Hepatic negative GI ROS, Neg liver ROS,   Endo/Other  Morbid obesity  Renal/GU negative Renal ROS  negative genitourinary   Musculoskeletal negative musculoskeletal ROS (+)   Abdominal   Peds  Hematology negative hematology ROS (+)   Anesthesia Other Findings Presents in labor  Reproductive/Obstetrics (+) Pregnancy                            Anesthesia Physical Anesthesia Plan  ASA: 3  Anesthesia Plan: Epidural   Post-op Pain Management:    Induction:   PONV Risk Score and Plan: Treatment may vary due to age or medical condition  Airway Management Planned: Natural Airway  Additional Equipment:   Intra-op Plan:   Post-operative Plan:   Informed Consent: I have reviewed the patients History and Physical, chart, labs and discussed the procedure including the risks, benefits and alternatives for the proposed anesthesia with the patient or authorized representative who has indicated his/her understanding and acceptance.       Plan Discussed with: Anesthesiologist  Anesthesia Plan Comments: (Patient identified. Risks, benefits, options discussed with patient including but not limited to bleeding, infection, nerve damage, paralysis, failed block, incomplete pain control, headache, blood pressure changes, nausea, vomiting, reactions to medication, itching,  and post partum back pain. Confirmed with bedside nurse the patient's most recent platelet count. Confirmed with the patient that they are not taking any anticoagulation, have any bleeding history or any family history of bleeding disorders. Patient expressed understanding and wishes to proceed. All questions were answered. )       Anesthesia Quick Evaluation

## 2022-01-20 NOTE — Progress Notes (Signed)
Labor Progress Note Regina Cunningham is a 41 y.o. [id number] at [redacted]w[redacted]d presented for SROM after an uncomplicated pregnancy.  S:  Pt resting in bed, feeling a little dizzy and nauseated since epidural, but doing ok overall. FOB at bedside for support. Interpreter present via Str[employer] video throughout assessment and discussion.  O:  BP 107/62    Pulse 92    Temp 98.5 F (36.9 C) (Oral)    Resp 16    LMP 04/18/2021 (Exact Date)    SpO2 99%  EFM: baseline 145 bpm/ moderate variability/ 15x15 accels/occasional variable decels, overall very reassuring Toco/IUPC: irregular/dysfunctional pattern of q2-76min (usually rounds of 2-3ctx q10min followed by a 35min break) SVE: Dilation: 9 Effacement (%): 100 Cervical Position: Posterior Station: 0 Presentation: Vertex Exam by:: J Ulas Zuercher CNM Pitocin: to start, combined with position changes to facilitate fetal descent  A/P: 41 y.o. [id number] [redacted]w[redacted]d  1. Labor: transitional but baby still high in pelvis, will start low dose pitocin 2. FWB: Cat 1, amnioinfusion running with good return of fluid 3. Pain: well-controlled with epidural 4. All questions answered, pt comfortable with interventions. Will give 595ml bolus to see if it improves her dizziness/nausea.  Anticipate SVD.  Gabriel Carina, CNM 1:35 PM

## 2022-01-21 NOTE — Progress Notes (Signed)
Neta Ehlers RN at bedside

## 2022-01-21 NOTE — Progress Notes (Signed)
Regina Cunningham at bedside

## 2022-01-21 NOTE — Anesthesia Postprocedure Evaluation (Signed)
Anesthesia Post Note  Patient: Regina Cunningham  Procedure(s) Performed: AN AD HOC LABOR EPIDURAL     Patient location during evaluation: Mother Baby Anesthesia Type: Epidural Level of consciousness: awake Pain management: satisfactory to patient Vital Signs Assessment: post-procedure vital signs reviewed and stable Respiratory status: spontaneous breathing Cardiovascular status: stable Anesthetic complications: no   No notable events documented.  Last Vitals:  Vitals:   01/21/22 0423 01/21/22 0800  BP: 106/78 105/64  Pulse: 70 75  Resp: 16 18  Temp: 36.8 C 36.9 C  SpO2:  99%    Last Pain:  Vitals:   01/21/22 0800  TempSrc: Oral  PainSc: 2    Pain Goal:                   [employer]

## 2022-01-22 MED ORDER — IBUPROFEN 600 MG PO TABS: 600.0000 mg | ORAL_TABLET | Freq: Four times a day (QID) | ORAL | 0 refills | Status: DC | PRN

## 2022-01-22 NOTE — Discharge Instructions (Signed)
NO SEX UNTIL AFTER YOU GET YOUR BIRTH CONTROL  

## 2022-01-22 NOTE — Progress Notes (Signed)
Please call patient at her number [phone] for anything related to her care.  She speaks Spanish and states her husband does not answer the phone frequently.

## 2022-01-24 ENCOUNTER — Ambulatory Visit: Payer: Self-pay | Admitting: Student

## 2022-01-30 ENCOUNTER — Telehealth (HOSPITAL_COMMUNITY): Payer: Self-pay | Admitting: *Deleted

## 2022-01-30 NOTE — Telephone Encounter (Signed)
Mom reports feeling good. No concerns about herself at this time. EPDS=3(Hospital score=7) Mom reports baby is doing well. Feeding, peeing, and pooping without difficulty. Safe sleep reviewed. Mom reports no concerns about baby at present.  Odis Hollingshead, RN 01-30-2022 at 10:18am

## 2022-02-24 ENCOUNTER — Telehealth: Payer: Self-pay

## 2022-02-24 NOTE — Patient Instructions (Incomplete)
Fue maravilloso verte hoy. ? ?Por favor traiga TODOS sus medicamentos a cada visita. ? ?Hoy hablamos de: ? ?-Su precion hoy fue ***  ? ?Gracias por elegir Medicina familiar [place]. ? ?Llame al [phone] si tiene alguna pregunta sobre la cita de hoy. ? ?Aseg?rese de programar un seguimiento en la recepci?n antes de irse hoy. ? ?Sharion Settler, D.O. ?PGY-2 Medicina Familiar  ?

## 2022-02-24 NOTE — Telephone Encounter (Signed)
Received phone call from Herminie, RN with [facility] regarding patient having elevated BP. At visit today BP was elevated at 138/94. Patient is asymptomatic.  ? ?Called patient with Spanish interpreter [phone] and discussed. Scheduled patient for follow up visit on 02/27/22 for further evaluation. Offered to schedule for next available on Wednesday, however, patient has another appointment on this day.  ? ?Provided patient with strict ED precautions. Patient verbalizes understanding.  ? ?Talbot Grumbling, RN ? ?

## 2022-02-24 NOTE — Progress Notes (Deleted)
? ?Regina Cunningham is a 41 y.o. [id number] female who presents for a postpartum visit. She is 5 weeks postpartum following a normal spontaneous vaginal delivery.  I have fully reviewed the prenatal and intrapartum course. The delivery was at [redacted]w[redacted]d  Anesthesia: epidural. NICU and respiratory were present at delivery given a deep deceleration with a trial push. Baby did not require transfer to NICU after delivery. APGAR's were 4, 9 at 1 and 5 minutes respectively. Postpartum course has been ***.  [facility] On 3/13 her BP was 138/94, she was asymptomatic at that time.Her prenatal history was only significant for advanced maternal age.  Baby is doing well***. Baby is feeding by {breast/bottle:69}. Bleeding {vag bleed:12292}. Bowel function is {normal:32111}. Bladder function is {normal:32111}. Patient {is/is not:9024} sexually active. Contraception method is {contraceptive method:5051}. Postpartum depression screening: {gen negative/positive:315881}. ? ? ?The pregnancy intention screening data noted above was reviewed. Potential methods of contraception were discussed. The patient elected to proceed with No data recorded. ? ? ? ?Health Maintenance Due  ?Topic Date Due  ? COVID-19 Vaccine (4 - Booster for Pfizer series) 05/14/2021  ? ? ?{Common ambulatory SmartLinks:19316} ? ?Review of Systems ?{ros; complete:30496} ? ?Objective:  ?LMP 04/18/2021 (Exact Date)   ? ?General:  {gen appearance:16600}  ? Breasts:  {desc; normal/abnormal/not indicated:14647}  ?Lungs: {lung exam:16931}  ?Heart:  {heart exam:5510}  ?Abdomen: {abdomen exam:16834}   ?Wound {Wound assessment:11097}  ?GU exam:  {desc; normal/abnormal/not indicated:14647}  ?     ?Assessment:  ? ?'@vitals'$ @ ? ?General: Awake, alert, oriented, in no acute distress, pleasant and cooperative with examination ?HEENT: Normocephalic, atraumatic, nares patent, dentition is good, oropharynx without erythema or exudates,  TM's clear bilaterally, no thyroid nodules palpated ?Cardio: RRR without murmur, 2+ radial, DP and PT pulses b/l ?Respiratory: CTAB without wheezing/rhonchi/rales ?Abdomen: Soft, non-tender to palpation of all quadrants, non-distended, no rebound/guarding, no organomegaly ?MSK: Able to move all extremities spontaneously, good muscle strength, no abnormalities ?Extremities: without edema or cyanosis ?Neuro: Speech is clear and intact, no focal deficits, no facial asymmetry, follows commands  ?Psych: Normal mood and affect ? ?*** postpartum exam.  ? ?Plan:  ? ?Essential components of care per ACOG recommendations: ? ?1.  Mood and well being: Patient with {gen negative/positive:315881} depression screening today. Reviewed local resources for support.  ?- Patient tobacco use? {tobacco use:25506}  ?- hx of drug use? {yes/no:25505}   ? ?2. Infant care and feeding:  ?-Patient currently breastmilk feeding? {yes/no:25502}  ?-Social determinants of health (SDOH) reviewed in EPIC. No concerns***The following needs were identified*** ? ?3. Sexuality, contraception and birth spacing ?- Patient {DOES_DOES NFUX:32355}want a pregnancy in the next year.  Desired family size is {NUMBER 1-10:22536} children.  ?- Reviewed forms of contraception in tiered fashion. Patient desired {PLAN CONTRACEPTION:313102} today.   ?- Discussed birth spacing of 18 months ? ?4. Sleep and fatigue ?-Encouraged family/partner/community support of 4 hrs of uninterrupted sleep to help with mood and fatigue ? ?5. Physical Recovery  ?- Discussed patients delivery and complications. She describes her labor as {description:25511} ?- Patient had a {CHL AMB DELIVERY:[phone]}. Patient had a {laceration:25518} laceration. Perineal healing reviewed. Patient expressed understanding ?- Patient has urinary incontinence? {yes/no:25515} ?- Patient {ACTION; IS/IS NDDU:20254270}safe to resume physical and sexual activity ? ?6.  Health Maintenance ?- HM due items  addressed {Yes or If no, why not?:20788} ?- Last pap smear  ?Diagnosis  ?Date Value Ref Range Status  ?08/23/2021  Final  ? - Negative for intraepithelial lesion or malignancy (NILM)  ? Pap smear not done at today's visit.  ?-Breast Cancer screening indicated? No.  ? ?7. Chronic Disease/Pregnancy Condition follow up: {Follow up:25499} ? ?8. Blood Pressure *** today.  ? ? ?Sharion Settler ?PGY-2 Family Medicine  ? ? ? ?

## 2022-02-27 ENCOUNTER — Ambulatory Visit: Payer: Self-pay

## 2022-03-06 ENCOUNTER — Ambulatory Visit: Payer: Self-pay | Admitting: Family Medicine

## 2022-03-27 ENCOUNTER — Ambulatory Visit: Payer: Self-pay | Admitting: Family Medicine

## 2022-03-27 NOTE — Progress Notes (Deleted)
? ?  SUBJECTIVE:  ? ?CHIEF COMPLAINT / HPI:  ? ?No chief complaint on file. ? ? ? ?Regina Cunningham is a 41 y.o. female here for elevated blood pressure.  Patient delivered early February a healthy baby boy.  A month ago and RN from the [facility] called about patient's elevated blood pressure, 138/94.  ? ?Pt reports ***  ? ? ?PERTINENT  PMH / PSH: reviewed and updated as appropriate  ? ?OBJECTIVE:  ? ?LMP 04/18/2021 (Exact Date)   ?*** ? ?ASSESSMENT/PLAN:  ? ?No problem-specific Assessment & Plan notes found for this encounter. ?  ? ? ?Lyndee Hensen, DO ?PGY-3, [facility] ?03/27/2022  ? ? ? ? ?{    This will disappear when note is signed, click to select method of visit    :1} ? ? ? ?

## 2024-06-30 LAB — OB RESULTS CONSOLE HEPATITIS B SURFACE ANTIGEN: Hepatitis B Surface Ag: NEGATIVE

## 2024-06-30 LAB — HEPATITIS C ANTIBODY: HCV Ab: NEGATIVE

## 2024-06-30 LAB — OB RESULTS CONSOLE RPR: RPR: NONREACTIVE

## 2024-06-30 LAB — OB RESULTS CONSOLE HIV ANTIBODY (ROUTINE TESTING): HIV: NONREACTIVE

## 2024-06-30 LAB — OB RESULTS CONSOLE GC/CHLAMYDIA
Chlamydia: NEGATIVE
Neisseria Gonorrhea: NEGATIVE

## 2024-06-30 LAB — OB RESULTS CONSOLE RUBELLA ANTIBODY, IGM: Rubella: IMMUNE

## 2024-06-30 LAB — OB RESULTS CONSOLE ANTIBODY SCREEN: Antibody Screen: NEGATIVE

## 2024-07-12 ENCOUNTER — Other Ambulatory Visit: Payer: Self-pay | Admitting: Nurse Practitioner

## 2024-07-12 DIAGNOSIS — Z36 Encounter for antenatal screening for chromosomal anomalies: Secondary | ICD-10-CM

## 2024-08-10 ENCOUNTER — Ambulatory Visit: Payer: MEDICAID | Attending: Obstetrics and Gynecology | Admitting: Maternal & Fetal Medicine

## 2024-08-10 ENCOUNTER — Other Ambulatory Visit: Payer: Self-pay | Admitting: *Deleted

## 2024-08-10 ENCOUNTER — Ambulatory Visit (HOSPITAL_BASED_OUTPATIENT_CLINIC_OR_DEPARTMENT_OTHER): Payer: MEDICAID

## 2024-08-10 VITALS — BP 129/73 | HR 78

## 2024-08-10 DIAGNOSIS — Z36 Encounter for antenatal screening for chromosomal anomalies: Secondary | ICD-10-CM | POA: Insufficient documentation

## 2024-08-10 DIAGNOSIS — G930 Cerebral cysts: Secondary | ICD-10-CM

## 2024-08-10 DIAGNOSIS — Z3A20 20 weeks gestation of pregnancy: Secondary | ICD-10-CM

## 2024-08-10 DIAGNOSIS — O365990 Maternal care for other known or suspected poor fetal growth, unspecified trimester, not applicable or unspecified: Secondary | ICD-10-CM

## 2024-08-10 DIAGNOSIS — O99212 Obesity complicating pregnancy, second trimester: Secondary | ICD-10-CM | POA: Insufficient documentation

## 2024-08-10 DIAGNOSIS — O09522 Supervision of elderly multigravida, second trimester: Secondary | ICD-10-CM

## 2024-08-10 DIAGNOSIS — Z363 Encounter for antenatal screening for malformations: Secondary | ICD-10-CM | POA: Insufficient documentation

## 2024-08-10 DIAGNOSIS — E669 Obesity, unspecified: Secondary | ICD-10-CM

## 2024-08-10 DIAGNOSIS — O3503X0 Maternal care for (suspected) central nervous system malformation or damage in fetus, choroid plexus cysts, not applicable or unspecified: Secondary | ICD-10-CM

## 2024-08-10 DIAGNOSIS — Z6841 Body Mass Index (BMI) 40.0 and over, adult: Secondary | ICD-10-CM

## 2024-08-10 DIAGNOSIS — O09212 Supervision of pregnancy with history of pre-term labor, second trimester: Secondary | ICD-10-CM | POA: Insufficient documentation

## 2024-08-10 DIAGNOSIS — Z3A19 19 weeks gestation of pregnancy: Secondary | ICD-10-CM

## 2024-08-10 DIAGNOSIS — O09523 Supervision of elderly multigravida, third trimester: Secondary | ICD-10-CM | POA: Insufficient documentation

## 2024-08-10 NOTE — Progress Notes (Signed)
 MFM Consultation  [name] is a 43 yo G6 P5 at 2 w 3d. She is seen at the request of Fidela Pizza, NP wth [facility].   Single intrauterine pregnancy here for a detailed anatomy due to elevated BMI and AMA 43 yo.  Normal anatomy with measurements consistent with today's ultrasound, she shared that her LMP was around the day she conveyed by she wasn't sexually active for a few weeks after, so she is uncertain.  There is good fetal movement and amniotic fluid volume Suboptimal views of the fetal anatomy were obtained secondary to fetal position and elevated BMI.  I discussed today's visit with [name] via spanish interpreter. The fetal growth was at the 3.6% percentile if dated by LMP. She was concered about the dating given her estimated fetal weight and time of perceieved conception. We performed UAD and they were normal.   Single intrauterine pregnancy here for a detailed anatomy due to choroid plexus cyst. Normal anatomy with measurements consistent with dates There is good fetal movement and amniotic fluid volume  Choroid plexus cysts (CPCs) are well-demarcated, anechoic, fluid-filled structures within the choroid plexus of the lateral ventricles of the brain. They are not true cysts in the pathologic sense. CPCs are often called soft sonographic signs or markers of aneuploidy, because some studies have found an association between them and fetal chromosomal abnormalities.   When the CPCs are isolated, some consider them an anatomic variant.  The incidence ranges from 0.18% to 3.6% of fetuses scanned in the second trimester.   CPCs may be an isolated finding or associated with fetal anomalies. Associated anomalies are typically those seen with trisomy 18 and include congenital heart disease, clenched hands, single umbilical artery, intrauterine growth restriction, and rocker bottom feet. A CPC is not a congenital brain defect.  In general, when a choroid plexus  cyst is noted as an isolated finding and no other high-risk issues are noted, invasive karyotype analysis via an amniocentesis is usually not recommended.   I explained that this is an isolated finding genetic screening and counseling was offered.  I discussed in detail today's exam and decision making. We have dated her by today's exam with a new EDD of 01/04/25.  She is uncertain if she had cell free DNA we offered her testing and she declined at this time, but would rather discuss with her significant other. She also agreed to meet withou our genetic counselor at her follow up visit.   Lastly, we discussed the increased risk for GDM, preeclampsia, FGR and stillbirth in women > 45 yo at the time of birth we recommend serial growth exams with weekly testing beginning at 34-36 weeks.  All questions answered  I spent 45 minutes with > 50% in face to face consultation. Regina DOROTHA Fetters, MD  Repeat growth was scheduled in 4 weeks.

## 2024-08-24 DIAGNOSIS — O365990 Maternal care for other known or suspected poor fetal growth, unspecified trimester, not applicable or unspecified: Secondary | ICD-10-CM | POA: Insufficient documentation

## 2024-08-24 DIAGNOSIS — O99210 Obesity complicating pregnancy, unspecified trimester: Secondary | ICD-10-CM | POA: Insufficient documentation

## 2024-09-05 ENCOUNTER — Ambulatory Visit (HOSPITAL_BASED_OUTPATIENT_CLINIC_OR_DEPARTMENT_OTHER): Payer: MEDICAID | Admitting: Obstetrics and Gynecology

## 2024-09-05 ENCOUNTER — Ambulatory Visit: Payer: MEDICAID | Attending: Maternal & Fetal Medicine

## 2024-09-05 ENCOUNTER — Other Ambulatory Visit: Payer: Self-pay | Admitting: *Deleted

## 2024-09-05 VITALS — BP 126/70 | HR 93

## 2024-09-05 DIAGNOSIS — O09522 Supervision of elderly multigravida, second trimester: Secondary | ICD-10-CM

## 2024-09-05 DIAGNOSIS — O365990 Maternal care for other known or suspected poor fetal growth, unspecified trimester, not applicable or unspecified: Secondary | ICD-10-CM

## 2024-09-05 DIAGNOSIS — O99210 Obesity complicating pregnancy, unspecified trimester: Secondary | ICD-10-CM

## 2024-09-05 DIAGNOSIS — E669 Obesity, unspecified: Secondary | ICD-10-CM

## 2024-09-05 DIAGNOSIS — Z3A24 24 weeks gestation of pregnancy: Secondary | ICD-10-CM

## 2024-09-05 DIAGNOSIS — E66813 Obesity, class 3: Secondary | ICD-10-CM

## 2024-09-05 DIAGNOSIS — O99212 Obesity complicating pregnancy, second trimester: Secondary | ICD-10-CM

## 2024-09-05 DIAGNOSIS — Z3A22 22 weeks gestation of pregnancy: Secondary | ICD-10-CM

## 2024-09-05 DIAGNOSIS — O09899 Supervision of other high risk pregnancies, unspecified trimester: Secondary | ICD-10-CM

## 2024-09-05 DIAGNOSIS — O0942 Supervision of pregnancy with grand multiparity, second trimester: Secondary | ICD-10-CM

## 2024-09-05 DIAGNOSIS — O09212 Supervision of pregnancy with history of pre-term labor, second trimester: Secondary | ICD-10-CM

## 2024-09-05 NOTE — Progress Notes (Signed)
 Maternal-Fetal Medicine Consultation  Name: Regina Cunningham  MRN: [id number]  GA: [id number] [redacted]w[redacted]d   Patient is here for completion of fetal anatomy.  Bilateral choroid plexus cysts were seen with anatomy scan and the patient was counseled on the finding.  Ultrasound Fetal growth is appropriate for gestational age.  Amniotic fluid is normal with good fetal activity seen.  Fetal anatomical survey was completed and appears normal.  Choroid plexus cysts have resolved I reassured the patient of the findings  I discussed the significance of advanced maternal age (>40 years) and grade 3 obesity.  Grade 3 obesity is independently associated with increased risk of stillbirth (2.5- to 3-fold), but the absolute risk is very small.  I discussed protocol of weekly antenatal testing from [redacted] weeks gestation until delivery. Obesity is associated with increased risk for gestational diabetes and gestational hypertension. Ultrasound has limitations in the resolution of ultrasound images and fetal anomalies may be missed.    The risk of stillbirth is slightly increased (2- to 3-fold), but the absolute risk is very small.  I discussed and recommended antenatal testing from [redacted] weeks gestation until delivery.  I recommended delivery at [redacted] weeks gestation.  Both grade 3 obesity and advanced maternal age are independent risk factors for stillbirth, and I strongly recommended weekly antenatal testing from [redacted] weeks gestation until delivery.  I recommended delivery at [redacted] weeks gestation.  Patient was counseled at the help of Spanish language interpreter present in the room.  Recommendations - An appointment was made for her to return in 6 weeks for fetal growth assessment. - Weekly antenatal testing from [redacted] weeks gestation until delivery. - Delivery at [redacted] weeks gestation.     Consultation including face-to-face (more than 50%) counseling 20 minutes.

## 2024-10-17 ENCOUNTER — Ambulatory Visit: Payer: MEDICAID | Attending: Obstetrics and Gynecology | Admitting: Obstetrics

## 2024-10-17 ENCOUNTER — Ambulatory Visit (HOSPITAL_BASED_OUTPATIENT_CLINIC_OR_DEPARTMENT_OTHER): Payer: MEDICAID

## 2024-10-17 ENCOUNTER — Other Ambulatory Visit: Payer: Self-pay | Admitting: *Deleted

## 2024-10-17 VITALS — BP 135/58 | HR 91

## 2024-10-17 DIAGNOSIS — O99210 Obesity complicating pregnancy, unspecified trimester: Secondary | ICD-10-CM

## 2024-10-17 DIAGNOSIS — O09899 Supervision of other high risk pregnancies, unspecified trimester: Secondary | ICD-10-CM

## 2024-10-17 DIAGNOSIS — E669 Obesity, unspecified: Secondary | ICD-10-CM

## 2024-10-17 DIAGNOSIS — O09523 Supervision of elderly multigravida, third trimester: Secondary | ICD-10-CM

## 2024-10-17 DIAGNOSIS — O09522 Supervision of elderly multigravida, second trimester: Secondary | ICD-10-CM

## 2024-10-17 DIAGNOSIS — O09893 Supervision of other high risk pregnancies, third trimester: Secondary | ICD-10-CM | POA: Insufficient documentation

## 2024-10-17 DIAGNOSIS — O365990 Maternal care for other known or suspected poor fetal growth, unspecified trimester, not applicable or unspecified: Secondary | ICD-10-CM

## 2024-10-17 DIAGNOSIS — Z3A28 28 weeks gestation of pregnancy: Secondary | ICD-10-CM | POA: Insufficient documentation

## 2024-10-17 DIAGNOSIS — O99213 Obesity complicating pregnancy, third trimester: Secondary | ICD-10-CM | POA: Insufficient documentation

## 2024-10-17 DIAGNOSIS — O99343 Other mental disorders complicating pregnancy, third trimester: Secondary | ICD-10-CM

## 2024-10-17 DIAGNOSIS — O09213 Supervision of pregnancy with history of pre-term labor, third trimester: Secondary | ICD-10-CM | POA: Insufficient documentation

## 2024-10-17 DIAGNOSIS — O0943 Supervision of pregnancy with grand multiparity, third trimester: Secondary | ICD-10-CM

## 2024-10-17 NOTE — Progress Notes (Signed)
 MFM Consult Note  Regina Cunningham is currently at 28 weeks and 5 days.  She has been followed due to advanced maternal age (43 years old), maternal obesity with a BMI of 44, and grand multiparity.    She denies any problems since her last exam and has screened negative for gestational diabetes.  Sonographic findings Single intrauterine pregnancy at 28w 5d.  Fetal cardiac activity:  Observed and appears normal. Presentation: Transverse, head to maternal right. Fetal biometry shows the estimated fetal weight of 3 pounds 3 ounces which measures at at the 76th percentile. Amniotic fluid volume: Within normal limits. AFI: 21.19 cm. MVP: 6.79 cm. Placenta: Posterior.  There are limitations of prenatal ultrasound such as the inability to detect certain abnormalities due to poor visualization. Various factors such as fetal position, gestational age and maternal body habitus may increase the difficulty in visualizing the fetal anatomy.    Advanced maternal age and maternal obesity  We will start weekly fetal testing at 34 weeks.  Delivery should occur at around 39 weeks.  A follow-up growth scan and BPP was scheduled in 5 weeks.    The patient stated that all of her questions were answered today.    All conversations were held with the patient today with the help of a Spanish interpreter.  A total of 20 minutes was spent counseling and coordinating the care for this patient.  Greater than 50% of the time was spent in direct face-to-face contact.

## 2024-11-22 ENCOUNTER — Telehealth: Payer: Self-pay

## 2024-11-22 ENCOUNTER — Ambulatory Visit (HOSPITAL_BASED_OUTPATIENT_CLINIC_OR_DEPARTMENT_OTHER): Payer: MEDICAID | Admitting: Obstetrics

## 2024-11-22 ENCOUNTER — Ambulatory Visit: Payer: MEDICAID | Attending: Obstetrics and Gynecology

## 2024-11-22 VITALS — BP 122/71

## 2024-11-22 DIAGNOSIS — O99213 Obesity complicating pregnancy, third trimester: Secondary | ICD-10-CM

## 2024-11-22 DIAGNOSIS — O0943 Supervision of pregnancy with grand multiparity, third trimester: Secondary | ICD-10-CM

## 2024-11-22 DIAGNOSIS — Z3A33 33 weeks gestation of pregnancy: Secondary | ICD-10-CM

## 2024-11-22 DIAGNOSIS — O09523 Supervision of elderly multigravida, third trimester: Secondary | ICD-10-CM

## 2024-11-22 DIAGNOSIS — O99210 Obesity complicating pregnancy, unspecified trimester: Secondary | ICD-10-CM

## 2024-11-22 NOTE — Progress Notes (Signed)
 MFM Consult Note  Regina Cunningham is currently at [redacted]w[redacted]d. She has been followed due to due to advanced maternal age (44 years old), maternal obesity with a BMI of 21, and grand multi parity.    She denies any problems since her last exam and reports that she has screened negative for gestational diabetes.    Her blood pressure today was 122/71.  Sonographic findings Single intrauterine pregnancy at 33w 6d.  Fetal cardiac activity:  Observed and appears normal. Presentation: Cephalic. Fetal biometry shows the estimated fetal weight of 5 lb 13 oz,  2645g (84%). Amniotic fluid volume: . AFI: 17.78cm.  MVP: 7.62 cm. Placenta: Posterior. BPP: 8/8.   Due to advanced maternal age and maternal obesity, we will continue to follow her with weekly fetal testing until delivery.    Delivery should occur at around 39 weeks.    She will return in 1 week for another BPP.  The patient stated that all of her questions were answered.   All conversations were held with the patient today with the help of a Spanish interpreter.  A total of 20 minutes was spent counseling and coordinating the care for this patient.  Greater than 50% of the time was spent in direct face-to-face contact.

## 2024-11-22 NOTE — Telephone Encounter (Signed)
 PAC INTERPRETER: Sheree PI#[id number]  if patient calls back see if she can come at 1pm

## 2024-11-29 ENCOUNTER — Ambulatory Visit: Payer: MEDICAID | Attending: Obstetrics and Gynecology

## 2024-11-29 ENCOUNTER — Ambulatory Visit (HOSPITAL_BASED_OUTPATIENT_CLINIC_OR_DEPARTMENT_OTHER): Payer: MEDICAID | Admitting: Maternal & Fetal Medicine

## 2024-11-29 VITALS — BP 118/79

## 2024-11-29 DIAGNOSIS — O99210 Obesity complicating pregnancy, unspecified trimester: Secondary | ICD-10-CM | POA: Insufficient documentation

## 2024-11-29 DIAGNOSIS — Z3A34 34 weeks gestation of pregnancy: Secondary | ICD-10-CM | POA: Insufficient documentation

## 2024-11-29 DIAGNOSIS — O09523 Supervision of elderly multigravida, third trimester: Secondary | ICD-10-CM

## 2024-11-29 NOTE — Progress Notes (Signed)
° °  Patient information  Patient Name: Regina Cunningham  Patient MRN:   [id number]  Referring practice: MFM Referring Provider: [facility])  Problem List   Patient Active Problem List   Diagnosis Date Noted   Obesity affecting pregnancy, antepartum 08/24/2024   Pregnancy affected by fetal growth restriction 08/24/2024   Advanced maternal age in multigravida 12/13/2021   Supervision of other normal pregnancy, antepartum 08/24/2021    Maternal Fetal medicine Consult  Regina Cunningham is a 43 y.o. H3E5894 at [redacted]w[redacted]d here for ultrasound and consultation. Regina Cunningham is doing well today. Today we focused on the following:   The patient is here for a BPP ultrasound due to elevated BMI.  The BPP is normal and the patient reports good fetal movement and she has no other complaints at this time.  Standard OB precautions were given to the patient. The patient had time to ask questions that were answered to her satisfaction.  She verbalized understanding and agrees to proceed with the plan above.   An in person interpreter was used in the patient's language of preference for today's visit.  Recommendations -Continue routine antenatal testing as scheduled with serial growth ultrasounds every 4 to 6 weeks as indicated. -Delivery timing likely around her due date or sooner if indicated  I spent 10 minutes reviewing the patients chart, including labs and images as well as counseling the patient about her medical conditions. Greater than 50% of the time was spent in direct face-to-face patient counseling.  Delora Smaller  MFM, [place]   11/29/2024  3:21 PM   Review of Systems: A review of systems was performed and was negative except per HPI   Vitals and Physical Exam    11/29/2024    1:29 PM 11/22/2024    1:24 PM 10/17/2024    8:22 AM  Vitals with BMI  Systolic 118 122 864  Diastolic 79 71 58  Pulse   91   Sitting comfortably on the sonogram table Nonlabored  breathing Normal rate and rhythm Abdomen is nontender  Past pregnancies OB History  Gravida Para Term Preterm AB Living  6 5 4 1  0 5  SAB IAB Ectopic Multiple Live Births     0 5    # Outcome Date GA Lbr Len/2nd Weight Sex Type Anes PTL Lv  6 Current           5 Term 01/20/22 [redacted]w[redacted]d 16:30 / 00:21 7 lb 11.1 oz (3.49 kg) M Vag-Spont EPI  LIV  4 Term 05/10/11 [redacted]w[redacted]d  7 lb (3.175 kg) F Vag-Spont None  LIV  3 Term 12/25/09 [redacted]w[redacted]d  7 lb (3.175 kg) M Vag-Spont None  LIV  2 Term 01/06/06 [redacted]w[redacted]d  7 lb (3.175 kg) M Vag-Spont None  LIV  1 Preterm 01/10/02 [redacted]w[redacted]d  4 lb (1.814 kg) F Vag-Spont None  LIV     Birth Comments: Spent one month in NICU, never found out reason for PTL     Future Appointments  Date Time Provider Department Center  12/06/2024  1:15 PM [facility] PROVIDER 1 [facility]  12/06/2024  1:30 PM [facility] US3 [facility]  12/13/2024  1:15 PM [facility] PROVIDER 1 [facility]  12/13/2024  1:30 PM [facility] US4 [facility]  12/20/2024  1:15 PM [facility] PROVIDER 1 [facility]  12/20/2024  1:30 PM [facility] US4 [facility]

## 2024-12-06 ENCOUNTER — Other Ambulatory Visit: Payer: Self-pay

## 2024-12-06 ENCOUNTER — Ambulatory Visit: Payer: MEDICAID

## 2024-12-06 ENCOUNTER — Ambulatory Visit: Payer: MEDICAID | Attending: Obstetrics and Gynecology

## 2024-12-06 DIAGNOSIS — O09523 Supervision of elderly multigravida, third trimester: Secondary | ICD-10-CM

## 2024-12-06 DIAGNOSIS — O99213 Obesity complicating pregnancy, third trimester: Secondary | ICD-10-CM

## 2024-12-06 NOTE — Procedures (Signed)
 Regina Cunningham 08-14-1981 [redacted]w[redacted]d  Fetus A Non-Stress Test Interpretation for 12/06/2024-NST only  Indication: Advanced Maternal Age >40 years, Preg obese  Fetal Heart Rate A Mode: External Baseline Rate (A): 150 bpm Variability: Moderate Accelerations: 15 x 15 Decelerations: None Multiple birth?: No  Uterine Activity Mode: Toco Contraction Frequency (min): none  Interpretation (Fetal Testing) Nonstress Test Interpretation: Reactive Comments: Tracing reviewed byDr. William

## 2024-12-12 LAB — OB RESULTS CONSOLE GBS: GBS: NEGATIVE

## 2024-12-12 LAB — OB RESULTS CONSOLE GC/CHLAMYDIA
Chlamydia: NEGATIVE
Neisseria Gonorrhea: NEGATIVE

## 2024-12-13 ENCOUNTER — Ambulatory Visit: Payer: MEDICAID | Attending: Obstetrics and Gynecology

## 2024-12-13 ENCOUNTER — Other Ambulatory Visit: Payer: Self-pay | Admitting: *Deleted

## 2024-12-13 ENCOUNTER — Ambulatory Visit (HOSPITAL_BASED_OUTPATIENT_CLINIC_OR_DEPARTMENT_OTHER): Payer: MEDICAID | Admitting: Obstetrics

## 2024-12-13 VITALS — BP 126/72

## 2024-12-13 DIAGNOSIS — O99210 Obesity complicating pregnancy, unspecified trimester: Secondary | ICD-10-CM | POA: Insufficient documentation

## 2024-12-13 DIAGNOSIS — O0943 Supervision of pregnancy with grand multiparity, third trimester: Secondary | ICD-10-CM

## 2024-12-13 DIAGNOSIS — O09523 Supervision of elderly multigravida, third trimester: Secondary | ICD-10-CM

## 2024-12-13 DIAGNOSIS — Z3A36 36 weeks gestation of pregnancy: Secondary | ICD-10-CM

## 2024-12-13 DIAGNOSIS — O99213 Obesity complicating pregnancy, third trimester: Secondary | ICD-10-CM

## 2024-12-13 DIAGNOSIS — O09213 Supervision of pregnancy with history of pre-term labor, third trimester: Secondary | ICD-10-CM

## 2024-12-13 DIAGNOSIS — E669 Obesity, unspecified: Secondary | ICD-10-CM

## 2024-12-13 NOTE — Progress Notes (Signed)
 MFM Consult Note  Regina Cunningham is currently at [redacted]w[redacted]d. She has been followed due to advanced maternal age (43 years old) and maternal obesity with a BMI of 44.    She denies any problems since her last exam.  Her blood pressure today was 126/72.  The overall EFW obtained 3 weeks ago was 5 pounds 13 ounces (84th percentile).  Sonographic findings Single intrauterine pregnancy at 36w 6d. Fetal cardiac activity: Observed. Presentation: Cephalic. Amniotic fluid: Within normal limits. AFI: 16.54 cm,  MVP: 8.55 cm. Placenta: Posterior. BPP: 8/8.   Due to advanced maternal age and maternal obesity, delivery should be scheduled at around 39 weeks.    She will return in 1 week for another growth scan and BPP.    All conversations were held with the patient today with the help of a Spanish interpreter.  The patient stated that all of her questions were answered.   A total of 20 minutes was spent counseling and coordinating the care for this patient.  Greater than 50% of the time was spent in direct face-to-face contact.

## 2024-12-15 NOTE — L&D Delivery Note (Signed)
 Operative Delivery Note   Called to bedside to evaluate tracing and consider operative vaginal delivery.  FHT 90bpm with moderate variability.  Examination was performed and FSE unfortunately was displaced, pt was complete, but 0 station.   Pt given Terb and repositioned, FSE replaced.  Initially it had appeared to improve 130bpm; however then went down to 90bpm.   Despite Terb, the patient was still having regular contractions.  Pt was instructed to push with contraction and noted to have good fetal descent.  Due to Cat 2 tracing, plan was made to proceed with operative vaginal delivery.   Verbal consent: obtained from patient.  Spanish interpreter used throughout process.  Risks and benefits discussed in detail.  Risks include, but are not limited to the risks of bleeding, infection, damage to maternal tissues, fetal cephalhematoma and potential failed vacuum requiring emergent C-section.    Patient's bladder was noted to be empty, and there were no known fetal contraindications to operative vaginal delivery. Adequate anesthesia with epidural.  EFW was ~3600g by recent ultrasound.  The soft vacuum cup was positioned over the sagittal suture 3 cm anterior to posterior fontanelle with care made to avoid any vaginal tissue.  Pressure was then increased to 500 mmHg, and the patient was instructed to push.  Pulling was administered along the pelvic curve while patient was pushing; there were 4 contractions and 1 popoffs and good descent with each contraction was noted.  Vacuum was reduced in between contractions.  The infant was then delivered atraumatically, noted to be a viable female infant  Cord was clamped and cut and baby was handed off to awaiting pediatric team.    2nd degree perineal laceration noted requiring repair with 2-0 Vicryl and 3-0 vicryl in the usual fashion. Uterine atony noted with large gush- pt received TXA and cytotec  1000 mcg rectally. Manual sweep was performed, no significant  clots noted.  Large deaver was placed- no cervical laceration noted.  Bleeding improved and was also noted to be from left labia/side wall that was repaired with 3-0 vicryl.  EBL 550cc.  Due to considerable vaginal/perineal edema- foley catheter was placed.  Pt also given 3g IV Unasyn  due to manual sweep/instrumentatioSponge, instrument and needle counts were correct x 2.  The patient and baby were stable after delivery and remained in couplet care, with plans to transfer later to postpartum unit  APGAR: pending, weight pending .   Placenta status: to L&D .   Cord:  with the following complications: none.  Cord pH: obtained-results in baby's chart  Anesthesia:  epidural Instruments: Bell Episiotomy: None Lacerations: Labial/2nd degree Suture Repair: 2.0 3.0 vicryl Est. Blood Loss (mL):    Mom to postpartum.  Baby to NICU.  Galina Haddox, DO Attending Obstetrician & Gynecologist, [facility] for [employer], [facility]

## 2024-12-20 ENCOUNTER — Ambulatory Visit (HOSPITAL_BASED_OUTPATIENT_CLINIC_OR_DEPARTMENT_OTHER): Payer: MEDICAID | Admitting: Obstetrics

## 2024-12-20 ENCOUNTER — Ambulatory Visit: Payer: MEDICAID | Attending: Obstetrics and Gynecology

## 2024-12-20 VITALS — BP 118/64

## 2024-12-20 DIAGNOSIS — O09213 Supervision of pregnancy with history of pre-term labor, third trimester: Secondary | ICD-10-CM | POA: Insufficient documentation

## 2024-12-20 DIAGNOSIS — Z362 Encounter for other antenatal screening follow-up: Secondary | ICD-10-CM | POA: Insufficient documentation

## 2024-12-20 DIAGNOSIS — Z3A37 37 weeks gestation of pregnancy: Secondary | ICD-10-CM

## 2024-12-20 DIAGNOSIS — O99210 Obesity complicating pregnancy, unspecified trimester: Secondary | ICD-10-CM

## 2024-12-20 DIAGNOSIS — Z3A35 35 weeks gestation of pregnancy: Secondary | ICD-10-CM | POA: Insufficient documentation

## 2024-12-20 DIAGNOSIS — Z3480 Encounter for supervision of other normal pregnancy, unspecified trimester: Secondary | ICD-10-CM | POA: Insufficient documentation

## 2024-12-20 DIAGNOSIS — O0943 Supervision of pregnancy with grand multiparity, third trimester: Secondary | ICD-10-CM | POA: Insufficient documentation

## 2024-12-20 DIAGNOSIS — Z3483 Encounter for supervision of other normal pregnancy, third trimester: Secondary | ICD-10-CM

## 2024-12-20 DIAGNOSIS — O09523 Supervision of elderly multigravida, third trimester: Secondary | ICD-10-CM | POA: Insufficient documentation

## 2024-12-20 DIAGNOSIS — Z3689 Encounter for other specified antenatal screening: Secondary | ICD-10-CM | POA: Insufficient documentation

## 2024-12-20 DIAGNOSIS — O99213 Obesity complicating pregnancy, third trimester: Secondary | ICD-10-CM | POA: Insufficient documentation

## 2024-12-20 DIAGNOSIS — E669 Obesity, unspecified: Secondary | ICD-10-CM

## 2024-12-20 DIAGNOSIS — O09293 Supervision of pregnancy with other poor reproductive or obstetric history, third trimester: Secondary | ICD-10-CM | POA: Insufficient documentation

## 2024-12-20 NOTE — Progress Notes (Signed)
 MFM Consult Note  Regina Cunningham is currently at [redacted]w[redacted]d. She has been followed due to advanced maternal age and maternal obesity with a BMI of 44.    She denies any problems since her last exam and reports feeling fetal movements throughout the day.    Her blood pressure today was 118/64.  Sonographic findings Single intrauterine pregnancy at 37w 6d. Fetal cardiac activity: Observed. Presentation: Cephalic. Fetal biometry shows the estimated fetal weight of 7 lb 15 oz,  3608g (84%). Amniotic fluid: Within normal limits. AFI: 12.96 cm.  MVP: 4.74 cm. Placenta: Posterior. BPP: 8/8.   Due to maternal obesity, she is already scheduled for induction on December 28, 2024.  [facility].    No further exams were scheduled in our office.    All conversations were held with the patient today with the help of a Spanish interpreter.  The patient stated that all of her questions were answered.   A total of 10 minutes was spent counseling and coordinating the care for this patient.  Greater than 50% of the time was spent in direct face-to-face contact.

## 2024-12-22 ENCOUNTER — Telehealth (HOSPITAL_COMMUNITY): Payer: Self-pay | Admitting: *Deleted

## 2024-12-22 ENCOUNTER — Encounter (HOSPITAL_COMMUNITY): Payer: Self-pay | Admitting: *Deleted

## 2024-12-22 NOTE — Telephone Encounter (Signed)
 Preadmission screen

## 2024-12-22 NOTE — Telephone Encounter (Signed)
 Interpreter number [phone] Preadmission screen

## 2024-12-28 ENCOUNTER — Other Ambulatory Visit: Payer: Self-pay

## 2024-12-28 ENCOUNTER — Inpatient Hospital Stay (HOSPITAL_COMMUNITY): Payer: MEDICAID | Admitting: Anesthesiology

## 2024-12-28 ENCOUNTER — Inpatient Hospital Stay (HOSPITAL_COMMUNITY)
Admission: RE | Admit: 2024-12-28 | Discharge: 2024-12-30 | DRG: 807 | Disposition: A | Discharge status: Home / Self Care | Payer: MEDICAID | Attending: Obstetrics & Gynecology | Admitting: Obstetrics & Gynecology

## 2024-12-28 ENCOUNTER — Encounter (HOSPITAL_COMMUNITY): Payer: Self-pay | Admitting: Family Medicine

## 2024-12-28 ENCOUNTER — Inpatient Hospital Stay (HOSPITAL_COMMUNITY): Payer: MEDICAID

## 2024-12-28 DIAGNOSIS — O358XX0 Maternal care for other (suspected) fetal abnormality and damage, not applicable or unspecified: Secondary | ICD-10-CM | POA: Diagnosis present

## 2024-12-28 DIAGNOSIS — O09523 Supervision of elderly multigravida, third trimester: Principal | ICD-10-CM | POA: Diagnosis present

## 2024-12-28 DIAGNOSIS — E66813 Obesity, class 3: Secondary | ICD-10-CM | POA: Diagnosis present

## 2024-12-28 DIAGNOSIS — Z603 Acculturation difficulty: Secondary | ICD-10-CM | POA: Diagnosis present

## 2024-12-28 DIAGNOSIS — O26893 Other specified pregnancy related conditions, third trimester: Secondary | ICD-10-CM | POA: Diagnosis present

## 2024-12-28 DIAGNOSIS — O99214 Obesity complicating childbirth: Principal | ICD-10-CM | POA: Diagnosis present

## 2024-12-28 DIAGNOSIS — Z3A39 39 weeks gestation of pregnancy: Secondary | ICD-10-CM

## 2024-12-28 DIAGNOSIS — Z7982 Long term (current) use of aspirin: Secondary | ICD-10-CM

## 2024-12-28 DIAGNOSIS — R030 Elevated blood-pressure reading, without diagnosis of hypertension: Secondary | ICD-10-CM | POA: Diagnosis present

## 2024-12-28 LAB — CBC
HCT: 40.2 % (ref 36.0–46.0)
Hemoglobin: 13.4 g/dL (ref 12.0–15.0)
MCH: 28.6 pg (ref 26.0–34.0)
MCHC: 33.3 g/dL (ref 30.0–36.0)
MCV: 85.9 fL (ref 80.0–100.0)
Platelets: 184 K/uL (ref 150–400)
RBC: 4.68 MIL/uL (ref 3.87–5.11)
RDW: 15.1 % (ref 11.5–15.5)
WBC: 8 K/uL (ref 4.0–10.5)
nRBC: 0 % (ref 0.0–0.2)

## 2024-12-28 LAB — TYPE AND SCREEN
ABO/RH(D): O POS
Antibody Screen: NEGATIVE

## 2024-12-28 MED ORDER — LACTATED RINGERS IV SOLN: 500.0000 mL | INTRAVENOUS | Status: DC | PRN

## 2024-12-28 MED ORDER — OXYTOCIN BOLUS FROM INFUSION
333.0000 mL | Freq: Once | INTRAVENOUS | Status: AC
  Administered 2024-12-28: 333 mL via INTRAVENOUS

## 2024-12-28 MED ORDER — FENTANYL-BUPIVACAINE-NACL 0.5-0.125-0.9 MG/250ML-% EP SOLN
12.0000 mL/h | EPIDURAL | Status: DC | PRN
  Administered 2024-12-28: 12 mL/h via EPIDURAL
  Filled 2024-12-28: qty 250

## 2024-12-28 MED ORDER — TRANEXAMIC ACID-NACL 1000-0.7 MG/100ML-% IV SOLN
1000.0000 mg | INTRAVENOUS | Status: AC
  Administered 2024-12-28: 1000 mg via INTRAVENOUS
  Filled 2024-12-28 (×2): qty 100

## 2024-12-28 MED ORDER — ACETAMINOPHEN 325 MG PO TABS: 650.0000 mg | ORAL_TABLET | ORAL | Status: DC | PRN

## 2024-12-28 MED ORDER — LACTATED RINGERS IV SOLN
INTRAVENOUS | Status: DC
  Filled 2024-12-28: qty 1000

## 2024-12-28 MED ORDER — OXYTOCIN-SODIUM CHLORIDE 30-0.9 UT/500ML-% IV SOLN: 2.5000 [IU]/h | INTRAVENOUS | Status: DC

## 2024-12-28 MED ORDER — MISOPROSTOL 50MCG HALF TABLET: 50.0000 ug | ORAL_TABLET | ORAL | Status: DC | PRN

## 2024-12-28 MED ORDER — LACTATED RINGERS IV SOLN: 500.0000 mL | Freq: Once | INTRAVENOUS | Status: DC

## 2024-12-28 MED ORDER — ONDANSETRON HCL 4 MG/2ML IJ SOLN: 4.0000 mg | Freq: Four times a day (QID) | INTRAMUSCULAR | Status: DC | PRN

## 2024-12-28 MED ORDER — TERBUTALINE SULFATE 1 MG/ML IJ SOLN
0.2500 mg | Freq: Once | INTRAMUSCULAR | Status: AC | PRN
  Administered 2024-12-28: 0.25 mg via SUBCUTANEOUS
  Filled 2024-12-28: qty 1

## 2024-12-28 MED ORDER — OXYCODONE-ACETAMINOPHEN 5-325 MG PO TABS: 1.0000 | ORAL_TABLET | ORAL | Status: DC | PRN

## 2024-12-28 MED ORDER — SOD CITRATE-CITRIC ACID 500-334 MG/5ML PO SOLN
30.0000 mL | ORAL | Status: DC | PRN
  Filled 2024-12-28: qty 30

## 2024-12-28 MED ORDER — LACTATED RINGERS AMNIOINFUSION
INTRAVENOUS | Status: DC
  Filled 2024-12-28 (×4): qty 1000

## 2024-12-28 MED ORDER — SODIUM CHLORIDE 0.9 % IV SOLN
3.0000 g | Freq: Once | INTRAVENOUS | Status: AC
  Administered 2024-12-28: 3 g via INTRAVENOUS
  Filled 2024-12-28: qty 8

## 2024-12-28 MED ORDER — LIDOCAINE HCL (PF) 1 % IJ SOLN: 30.0000 mL | INTRAMUSCULAR | Status: DC | PRN

## 2024-12-28 MED ORDER — MISOPROSTOL 50MCG HALF TABLET: 50.0000 ug | ORAL_TABLET | Freq: Once | ORAL | Status: DC

## 2024-12-28 MED ORDER — FENTANYL CITRATE (PF) 100 MCG/2ML IJ SOLN
INTRAMUSCULAR | Status: AC
  Filled 2024-12-28: qty 2

## 2024-12-28 MED ORDER — DIPHENHYDRAMINE HCL 50 MG/ML IJ SOLN
12.5000 mg | INTRAMUSCULAR | Status: DC | PRN
  Administered 2024-12-28: 12.5 mg via INTRAVENOUS
  Filled 2024-12-28: qty 1

## 2024-12-28 MED ORDER — OXYTOCIN-SODIUM CHLORIDE 30-0.9 UT/500ML-% IV SOLN
1.0000 m[IU]/min | INTRAVENOUS | Status: DC
  Administered 2024-12-28: 2 m[IU]/min via INTRAVENOUS
  Filled 2024-12-28: qty 500

## 2024-12-28 MED ORDER — MISOPROSTOL 200 MCG PO TABS
1000.0000 ug | ORAL_TABLET | Freq: Once | ORAL | Status: AC
  Administered 2024-12-28: 1000 ug via RECTAL

## 2024-12-28 MED ORDER — PHENYLEPHRINE 80 MCG/ML (10ML) SYRINGE FOR IV PUSH (FOR BLOOD PRESSURE SUPPORT): 80.0000 ug | PREFILLED_SYRINGE | INTRAVENOUS | Status: DC | PRN

## 2024-12-28 MED ORDER — EPHEDRINE 5 MG/ML INJ: 10.0000 mg | INTRAVENOUS | Status: DC | PRN

## 2024-12-28 MED ORDER — OXYCODONE-ACETAMINOPHEN 5-325 MG PO TABS: 2.0000 | ORAL_TABLET | ORAL | Status: DC | PRN

## 2024-12-28 MED ORDER — ACETAMINOPHEN 500 MG PO TABS
1000.0000 mg | ORAL_TABLET | Freq: Four times a day (QID) | ORAL | Status: DC | PRN
  Administered 2024-12-29 – 2024-12-30 (×2): 1000 mg via ORAL
  Filled 2024-12-28 (×2): qty 2

## 2024-12-28 MED ORDER — LIDOCAINE HCL (PF) 1 % IJ SOLN
INTRAMUSCULAR | Status: DC | PRN
  Administered 2024-12-28: 5 mL via EPIDURAL
  Administered 2024-12-28: 2 mL via EPIDURAL
  Administered 2024-12-28: 3 mL via EPIDURAL

## 2024-12-28 MED ORDER — MISOPROSTOL 200 MCG PO TABS
ORAL_TABLET | ORAL | Status: AC
  Filled 2024-12-28: qty 1

## 2024-12-28 MED ORDER — MISOPROSTOL 25 MCG QUARTER TABLET: 25.0000 ug | ORAL_TABLET | Freq: Once | ORAL | Status: DC

## 2024-12-28 NOTE — Anesthesia Procedure Notes (Signed)
 Epidural Patient location during procedure: OB Start time: 12/28/2024 5:32 PM End time: 12/28/2024 5:39 PM  Staffing Anesthesiologist: Epifanio Charleston, MD Performed: anesthesiologist   Preanesthetic Checklist Completed: patient identified, IV checked, site marked, risks and benefits discussed, surgical consent, monitors and equipment checked, pre-op evaluation and timeout performed  Epidural Patient position: sitting Prep: DuraPrep and site prepped and draped Patient monitoring: continuous pulse ox and blood pressure Approach: midline Location: L4-L5 Injection technique: LOR air  Needle:  Needle type: Tuohy  Needle gauge: 17 G Needle length: 9 cm and 9 Needle insertion depth: 6 cm Catheter type: closed end flexible Catheter size: 19 Gauge Catheter at skin depth: 12 (11->12 when sat upright) cm Test dose: negative  Assessment Events: blood not aspirated, no cerebrospinal fluid, injection not painful, no injection resistance, no paresthesia and negative IV test

## 2024-12-28 NOTE — Progress Notes (Signed)
 Patient ID: Regina Cunningham, female   DOB: 03-08-1981, 44 y.o.   MRN: [id number]  BP (!) 116/47   Pulse 72   Temp 98.4 F (36.9 C) (Oral)   Resp 18   Ht 5' 2 (1.575 m)   Wt 117.9 kg   LMP 03/20/2024   BMI 47.55 kg/m   Dilation: 4 Effacement (%): 70 Cervical Position: Posterior Station: -3 Presentation: Vertex Exam by:: Kamea Dacosta, CNM  Assessment/Plan: Recurrent variables with moderate variability noted on FHR tracing. Pitocin  stopped at 1722 and patient repositioned. Benefits/purpose of IUPC and amnioinfusion discussed with patient. Patient agreeable to plan. IUPC placed and amnioinfusion started promptly. FSE placed prior by RN. Cat 2 tracing with contractions q1-3 minutes via palpation of abdomen by CNM and verbalization of patient.   [employer] iPad used as an interpreter of assessment.   Signed: Yariana Hoaglund, MSN,CNM 12/28/2024, 6:17 PM

## 2024-12-28 NOTE — Anesthesia Preprocedure Evaluation (Signed)
"                                    Anesthesia Evaluation  Patient identified by MRN, date of birth, ID band Patient awake    Reviewed: Allergy & Precautions, Patient's Chart, lab work & pertinent test results  Airway Mallampati: III  TM Distance: >3 FB     Dental   Pulmonary neg pulmonary ROS   Pulmonary exam normal        Cardiovascular negative cardio ROS Normal cardiovascular exam     Neuro/Psych negative neurological ROS     GI/Hepatic negative GI ROS, Neg liver ROS,,,  Endo/Other    Class 3 obesity  Renal/GU negative Renal ROS     Musculoskeletal   Abdominal   Peds  Hematology negative hematology ROS (+)   Anesthesia Other Findings   Reproductive/Obstetrics                              Anesthesia Physical Anesthesia Plan  ASA: 3  Anesthesia Plan: Epidural   Post-op Pain Management:    Induction:   PONV Risk Score and Plan: 2 and Treatment may vary due to age or medical condition  Airway Management Planned: Natural Airway  Additional Equipment:   Intra-op Plan:   Post-operative Plan:   Informed Consent: I have reviewed the patients History and Physical, chart, labs and discussed the procedure including the risks, benefits and alternatives for the proposed anesthesia with the patient or authorized representative who has indicated his/her understanding and acceptance.       Plan Discussed with:   Anesthesia Plan Comments:         Anesthesia Quick Evaluation  "

## 2024-12-28 NOTE — Discharge Summary (Signed)
 "    Postpartum Discharge Summary  Date of Service updated-1/16     Patient Name: Regina Cunningham DOB: 08-13-1981 MRN: [id number]  Date of admission: 12/28/2024 Delivery date:12/28/2024 Delivering provider: LORELI IHA D Date of discharge: 12/30/2024  Admitting diagnosis: AMA (advanced maternal age) multigravida 35+, third trimester [O09.523] Intrauterine pregnancy: [redacted]w[redacted]d     Secondary diagnosis:  Principal Problem:   AMA (advanced maternal age) multigravida 35+, third trimester  Additional problems: Obesity    Discharge diagnosis: Term Pregnancy Delivered                                              Post partum procedures:none Augmentation: Pitocin  Complications: None  Hospital course: Induction of Labor With Vaginal Delivery   44 y.o. yo [phone] at [redacted]w[redacted]d was admitted to the hospital 12/28/2024 for induction of labor.  Indication for induction: AMA.  Patient had an labor course complicated by Cat 2 tracing- with variable decels.  Pt had internal monitors placed as well as amnioinfusion.  She rapidly progressed to complete. Membrane Rupture Time/Date: 4:48 PM,12/28/2024  Delivery Method:Vaginal, Spontaneous Operative Delivery:Device used:Bell Indication: Fetal indications Episiotomy: None Lacerations:  Labial;2nd degree Details of delivery can be found in separate delivery note.  Patient had a postpartum course that was uncomplicated. Patient is discharged home 12/30/24.  Newborn Data: Birth date:12/28/2024 Birth time:9:28 PM Gender:Female Living status:Living Apgars:2 ,8  Weight:3570 g  Magnesium Sulfate received: No BMZ received: No Rhophylac:No MMR:No T-DaP:offered Flu: No RSV Vaccine received: No Transfusion:No  Immunizations received: Immunization History  Administered Date(s) Administered   Influenza-Unspecified 11/27/2021   PFIZER(Purple Top)SARS-COV-2 Vaccination 07/15/2020, 08/30/2020, 03/19/2021   Tdap 11/27/2021    Physical exam  Vitals:    12/29/24 1229 12/29/24 1549 12/29/24 1923 12/30/24 0108  BP: (!) 116/56 (!) 120/42 119/87 112/62  Pulse: 81 89 87 79  Resp: 17 16 18 18   Temp: 98.2 F (36.8 C) 98.5 F (36.9 C) 98.4 F (36.9 C)   TempSrc: Oral Oral Oral   SpO2: 97% 95%  97%  Weight:      Height:       General: alert, cooperative, and no distress Lochia: appropriate Uterine Fundus: firm Incision: N/A DVT Evaluation: No evidence of DVT seen on physical exam. Labs: Lab Results  Component Value Date   WBC 8.0 12/28/2024   HGB 13.4 12/28/2024   HCT 40.2 12/28/2024   MCV 85.9 12/28/2024   PLT 184 12/28/2024      Latest Ref Rng & Units 12/03/2018   11:04 AM  CMP  Glucose 70 - 99 mg/dL 898   BUN 6 - 20 mg/dL 11   Creatinine 9.55 - 1.00 mg/dL 9.39   Sodium 864 - 854 mmol/L 139   Potassium 3.5 - 5.1 mmol/L 4.5   Chloride 98 - 111 mmol/L 102    Edinburgh Score:    12/29/2024   10:11 AM  Edinburgh Postnatal Depression Scale Screening Tool  I have been able to laugh and see the funny side of things. 0  I have looked forward with enjoyment to things. 0  I have blamed myself unnecessarily when things went wrong. 0  I have been anxious or worried for no good reason. 0  I have felt scared or panicky for no good reason. 0  Things have been getting on top of me. 1  I have been so unhappy that  I have had difficulty sleeping. 0  I have felt sad or miserable. 0  I have been so unhappy that I have been crying. 1  The thought of harming myself has occurred to me. 0  Edinburgh Postnatal Depression Scale Total 2   Edinburgh Postnatal Depression Scale Total: 2   After visit meds:  Allergies as of 12/30/2024   No Known Allergies      Medication List     STOP taking these medications    aspirin EC 81 MG tablet       TAKE these medications    acetaminophen  325 MG tablet Commonly known as: Tylenol  Take 2 tablets (650 mg total) by mouth every 6 (six) hours as needed (for pain scale < 4).   ibuprofen  600  MG tablet Commonly known as: ADVIL  Take 1 tablet (600 mg total) by mouth every 6 (six) hours. What changed:  when to take this reasons to take this   multivitamin-prenatal 27-0.8 MG Tabs tablet Take 1 tablet by mouth daily at 12 noon.   oxyCODONE  5 MG immediate release tablet Commonly known as: Oxy IR/ROXICODONE  Take 1 tablet (5 mg total) by mouth every 6 (six) hours as needed for up to 3 days for breakthrough pain or severe pain (pain score 7-10).         Discharge home in stable condition Infant Feeding: Bottle Infant Disposition:home with mother Discharge instruction: per After Visit Summary and Postpartum booklet. Activity: Advance as tolerated. Pelvic rest for 6 weeks.  Diet: routine diet Future Appointments:No future appointments. Follow up Visit:  Follow-up Information     Department, [facility]. Schedule an appointment as soon as possible for a visit in 6 week(s).   Why: For your postpartum appointment Contact information: [address] [place] [address] [phone]                  Please schedule this patient for a In person postpartum visit in 6 weeks with the following provider: Any provider. Additional Postpartum F/U:NA  Low risk pregnancy complicated by: AMA, Obesity Delivery mode:  Vaginal, Spontaneous Anticipated Birth Control:  Unsure- considering outpatient IUD   12/30/2024 Siniya Lichty M Dixie Jafri, DO    "

## 2024-12-28 NOTE — H&P (Addendum)
 OBSTETRIC ADMISSION HISTORY AND PHYSICAL  Regina Cunningham is a 44 y.o. female [phone] with IUP at [redacted]w[redacted]d by US  presenting for IOL for AMA and BMI 44. She reports +FMs, No LOF, no VB, no blurry vision, headaches or peripheral edema, and RUQ pain.  She plans on breast feeding. She is considering IUD for birth control. She received her prenatal care at [facility]   Dating: By US  --->  Estimated Date of Delivery: 01/04/25  Sono: @[redacted]w[redacted]d , CWD, normal anatomy, cephalic presentation, posterior placenta, 3608g, 84% EFW, 0.5 cm echogenic area seen at the level of the tricuspid valve previously   Prenatal History/Complications:        Nursing Staff Provider  Office Location  [facility]  Dating  US  at 17wk2day  Language  Spanish Anatomy US   wnl  Flu Vaccine  11/27/21 Genetic Screen  Declined     TDaP vaccine   11/27/21 Hgb A1C or  GTT Early 1hr wnl (126), 1 hr wnl (124) Third trimester   Rhogam  n/a   LAB RESULTS   Feeding Plan Formula Blood Type O/Positive/-- (09/02 9061)   Contraception Undecided - maybe IUD @HD ? Antibody Negative (09/02 9061)  Circumcision No Rubella 9.97 (09/02 0938)  Pediatrician  [facility] or [facility] RPR Non Reactive (11/29 1030)   Support Person   HBsAg Negative (09/02 9061)   Prenatal Classes declined HIV Non Reactive (11/29 1030)  BTL Consent   GBS  negative  VBAC Consent   Pap NILM  Resident PCP  [facility] Hgb Electro  Normal  COVID Vax  3 total monovalent. Declines booster.  CF    Hep C Antibody  Negative SMA    Aspirin indicated  Yes Waterbirth  Refer to [facility] -> [ ]  Class [ ]  Consent [ ]  CNM visit   Past Medical History: Past Medical History:  Diagnosis Date   Medical history non-contributory     Past Surgical History: Past Surgical History:  Procedure Laterality Date   NO PAST SURGERIES      Obstetrical History:   OB History  Gravida Para Term Preterm AB Living  6 5 4 1  0 5  SAB IAB Ectopic Multiple Live Births     0 5    # Outcome Date GA Lbr  Len/2nd Weight Sex Type Anes PTL Lv  6 Current           5 Term 01/20/22 [redacted]w[redacted]d 16:30 / 00:21 3490 g M Vag-Spont EPI  LIV  4 Term 05/10/11 [redacted]w[redacted]d  3175 g F Vag-Spont None  LIV  3 Term 12/25/09 [redacted]w[redacted]d  3175 g M Vag-Spont None  LIV  2 Term 01/06/06 [redacted]w[redacted]d  3175 g M Vag-Spont None  LIV  1 Preterm 01/10/02 [redacted]w[redacted]d  1814 g F Vag-Spont None  LIV     Birth Comments: Spent one month in NICU, never found out reason for PTL     Social History Social History   Socioeconomic History   Marital status: Married    Spouse name: Not on file   Number of children: Not on file   Years of education: Not on file   Highest education level: Not on file  Occupational History   Not on file  Tobacco Use   Smoking status: Never   Smokeless tobacco: Never  Vaping Use   Vaping status: Never Used  Substance and Sexual Activity   Alcohol use: Never   Drug use: Never   Sexual activity: Yes    Birth control/protection: I.U.D.  Other  Topics Concern   Not on file  Social History Narrative   Not on file   Social Drivers of Health   Tobacco Use: Low Risk (12/22/2024)   Patient History    Smoking Tobacco Use: Never    Smokeless Tobacco Use: Never    Passive Exposure: Not on file  Financial Resource Strain: Not on file  Food Insecurity: No Food Insecurity (12/28/2024)   Epic    Worried About Programme Researcher, Broadcasting/film/video in the Last Year: Never true    Ran Out of Food in the Last Year: Never true  Transportation Needs: No Transportation Needs (12/28/2024)   Epic    Lack of Transportation (Medical): No    Lack of Transportation (Non-Medical): No  Physical Activity: Not on file  Stress: Not on file  Social Connections: Not on file  Depression (PHQ2-9): Low Risk (01/16/2022)   Depression (PHQ2-9)    PHQ-2 Score: 0  Alcohol Screen: Not on file  Housing: Low Risk (12/28/2024)   Epic    Unable to Pay for Housing in the Last Year: No    Number of Times Moved in the Last Year: 0    Homeless in the Last Year: No   Utilities: Not At Risk (12/28/2024)   Epic    Threatened with loss of utilities: No  Health Literacy: Not on file    Family History: Family History  Problem Relation Age of Onset   Intellectual disability Neg Hx    Diabetes Neg Hx     Allergies: Allergies[1]  Medications Prior to Admission  Medication Sig Dispense Refill Last Dose/Taking   aspirin EC 81 MG tablet Take 81 mg by mouth daily. Swallow whole.   12/27/2024   Prenatal Vit-Fe Fumarate-FA (MULTIVITAMIN-PRENATAL) 27-0.8 MG TABS tablet Take 1 tablet by mouth daily at 12 noon.   12/27/2024   ibuprofen  (ADVIL ) 600 MG tablet Take 1 tablet (600 mg total) by mouth every 6 (six) hours as needed for mild pain, moderate pain or cramping. 30 tablet 0      Review of Systems   All systems reviewed and negative except as stated in HPI  Blood pressure (!) 145/77, pulse 78, temperature 98.6 F (37 C), temperature source Oral, resp. rate 16, height 5' 2 (1.575 m), weight 117.9 kg, last menstrual period 03/20/2024, unknown if currently breastfeeding. General appearance: alert, cooperative, and no distress Lungs: clear to auscultation bilaterally Heart: regular rate and rhythm Abdomen: soft, non-tender; bowel sounds normal Extremities: Homans sign is negative, no sign of DVT, trace edema Presentation: cephalic Fetal monitoringBaseline: 140 bpm, Variability: Good {> 6 bpm), and Accelerations: Reactive Uterine activity irregular Dilation: 4 Effacement (%): 60 Station: -3 Exam by:: Nicholaus, RN   Prenatal labs: ABO, Rh: --/--/O POS (01/14 1146) Antibody: NEG (01/14 1146) Rubella: Immune (07/17 0000) RPR: Nonreactive (07/17 0000)  HBsAg: Negative (07/17 0000)  HIV: Non-reactive (07/17 0000)  GBS: Negative/-- (12/29 0000)    Lab Results  Component Value Date   GBS Negative 12/12/2024   GTT normal Genetic screening  - per review of scanned records in media tab, normal Anatomy US  - normal, fundal posterior placenta. Did have  choroid plexus cyst but no other abnormal findings.   Immunization History  Administered Date(s) Administered   Influenza-Unspecified 11/27/2021   PFIZER(Purple Top)SARS-COV-2 Vaccination 07/15/2020, 08/30/2020, 03/19/2021   Tdap 11/27/2021  Per review of outside records in Media Tab:  - Tdap and flu vaccine received 10/13/24  Prenatal Transfer Tool  Maternal Diabetes: No Genetic Screening: Normal  Maternal Ultrasounds/Referrals: Other: choroid plexus cyst Fetal Ultrasounds or other Referrals:  Referred to Materal Fetal Medicine  for choroid plexus cyst Maternal Substance Abuse:  No Significant Maternal Medications:  None Significant Maternal Lab Results: Group B Strep negative Number of Prenatal Visits:greater than 3 verified prenatal visits Maternal Vaccinations:TDap and Flu Other Comments:  None   Results for orders placed or performed during the hospital encounter of 12/28/24 (from the past 24 hours)  CBC   Collection Time: 12/28/24 11:45 AM  Result Value Ref Range   WBC 8.0 4.0 - 10.5 K/uL   RBC 4.68 3.87 - 5.11 MIL/uL   Hemoglobin 13.4 12.0 - 15.0 g/dL   HCT 59.7 63.9 - 53.9 %   MCV 85.9 80.0 - 100.0 fL   MCH 28.6 26.0 - 34.0 pg   MCHC 33.3 30.0 - 36.0 g/dL   RDW 84.8 88.4 - 84.4 %   Platelets 184 150 - 400 K/uL   nRBC 0.0 0.0 - 0.2 %  Type and screen [facility]   Collection Time: 12/28/24 11:46 AM  Result Value Ref Range   ABO/RH(D) O POS    Antibody Screen NEG    Sample Expiration      12/31/2024,2359 Performed at [facility], [address], [place], [place] [address]     Patient Active Problem List   Diagnosis Date Noted   AMA (advanced maternal age) multigravida 35+, third trimester 12/28/2024   Obesity affecting pregnancy, antepartum 08/24/2024   Pregnancy affected by fetal growth restriction 08/24/2024   Advanced maternal age in multigravida 12/13/2021   Supervision of other normal pregnancy, antepartum 08/24/2021     Assessment/Plan:  Davon Folta is a 44 y.o. H3E5894 at [redacted]w[redacted]d here for AMA and BMI 44.   #Labor:IOL for AMA and BMI 44 - No need for cervical ripening as cervix is 4cm.  - uptitrate pitocin  per protocol - recheck in 4 hours  #Elevated BP No hx of chronic HTN or gHTN per lab records. BP on admission 145/77. Will repeat in 4hrs and if elevated again will order pre-eclampsia labs.   #Pain: Well-controlled currently, potentially interested in epidural later on #FWB: Category I tracing #GBS status:  negative #Feeding: Breastmilk  #Reproductive Life planning: IUD TBD #Circ:  not applicable  Garen SHAUNNA Puffer, MD  12/28/2024, 1:05 PM  Signed: Lamberto Dinapoli, MSN,CNM 12/28/2024, 1:54 PM       [1] No Known Allergies

## 2024-12-29 ENCOUNTER — Encounter (HOSPITAL_COMMUNITY): Payer: Self-pay | Admitting: Family Medicine

## 2024-12-29 LAB — SYPHILIS: RPR W/REFLEX TO RPR TITER AND TREPONEMAL ANTIBODIES, TRADITIONAL SCREENING AND DIAGNOSIS ALGORITHM: RPR Ser Ql: NONREACTIVE

## 2024-12-29 MED ORDER — ZOLPIDEM TARTRATE 5 MG PO TABS: 5.0000 mg | ORAL_TABLET | Freq: Every evening | ORAL | Status: DC | PRN

## 2024-12-29 MED ORDER — ACETAMINOPHEN 325 MG PO TABS
650.0000 mg | ORAL_TABLET | ORAL | Status: DC | PRN
  Administered 2024-12-29: 650 mg via ORAL
  Filled 2024-12-29: qty 2

## 2024-12-29 MED ORDER — PRENATAL MULTIVITAMIN CH
1.0000 | ORAL_TABLET | Freq: Every day | ORAL | Status: DC
  Administered 2024-12-29 – 2024-12-30 (×2): 1 via ORAL
  Filled 2024-12-29 (×2): qty 1

## 2024-12-29 MED ORDER — COCONUT OIL OIL: 1.0000 | TOPICAL_OIL | Status: DC | PRN

## 2024-12-29 MED ORDER — IBUPROFEN 600 MG PO TABS
600.0000 mg | ORAL_TABLET | Freq: Four times a day (QID) | ORAL | Status: DC
  Administered 2024-12-29 – 2024-12-30 (×7): 600 mg via ORAL
  Filled 2024-12-29 (×7): qty 1

## 2024-12-29 MED ORDER — SIMETHICONE 80 MG PO CHEW: 80.0000 mg | CHEWABLE_TABLET | ORAL | Status: DC | PRN

## 2024-12-29 MED ORDER — SENNOSIDES-DOCUSATE SODIUM 8.6-50 MG PO TABS
2.0000 | ORAL_TABLET | ORAL | Status: DC
  Administered 2024-12-29 – 2024-12-30 (×2): 2 via ORAL
  Filled 2024-12-29 (×2): qty 2

## 2024-12-29 MED ORDER — DIBUCAINE (PERIANAL) 1 % EX OINT: 1.0000 | TOPICAL_OINTMENT | CUTANEOUS | Status: DC | PRN

## 2024-12-29 MED ORDER — ONDANSETRON HCL 4 MG/2ML IJ SOLN: 4.0000 mg | INTRAMUSCULAR | Status: DC | PRN

## 2024-12-29 MED ORDER — ONDANSETRON HCL 4 MG PO TABS: 4.0000 mg | ORAL_TABLET | ORAL | Status: DC | PRN

## 2024-12-29 MED ORDER — BENZOCAINE-MENTHOL 20-0.5 % EX AERO
1.0000 | INHALATION_SPRAY | CUTANEOUS | Status: DC | PRN
  Administered 2024-12-29: 1 via TOPICAL
  Filled 2024-12-29: qty 56

## 2024-12-29 MED ORDER — DIPHENHYDRAMINE HCL 25 MG PO CAPS: 25.0000 mg | ORAL_CAPSULE | Freq: Four times a day (QID) | ORAL | Status: DC | PRN

## 2024-12-29 MED ORDER — WITCH HAZEL-GLYCERIN EX PADS
1.0000 | MEDICATED_PAD | CUTANEOUS | Status: DC | PRN
  Administered 2024-12-29: 1 via TOPICAL

## 2024-12-29 MED ORDER — TETANUS-DIPHTH-ACELL PERTUSSIS 5-2-15.5 LF-MCG/0.5 IM SUSP: 0.5000 mL | Freq: Once | INTRAMUSCULAR | Status: DC

## 2024-12-29 NOTE — Progress Notes (Signed)
 Post Partum Day 1  Subjective: Doing well. No acute events overnight. Pain is controlled and bleeding is appropriate. She is eating, drinking, voiding.  Foley cath in place with clear urine this am.   She is plans on breast feeding- currently baby in NICU. She has no other concerns at this time.  Spanish interpreter prsent  Objective: Blood pressure (!) 108/95, pulse 75, temperature 98.4 F (36.9 C), temperature source Oral, resp. rate 18, height 5' 2 (1.575 m), weight 117.9 kg, last menstrual period 03/20/2024, SpO2 98%, unknown if currently breastfeeding.  Physical Exam:  General: alert, cooperative, and no distress CV: RRR Lungs: CTAB, normal respiratory effort Lochia: appropriate Abd: soft, non-tender Uterine Fundus: firm, below umbilicus DVT Evaluation: No evidence of DVT seen on physical exam.  Recent Labs    12/28/24 1145  HGB 13.4  HCT 40.2    Assessment/Plan: Regina Cunningham is a 44 y.o. H3E4894 on PPD# 1 s/p VAVD. -foley to be removed this am, encourage ambulation this am  Progressing well. Meeting postpartum milestones. VSS. Continue routine postpartum care.  Feeding: in NICU/plans on breastfeeding Contraception: unsure Circumcision: NA  Dispo: Continue routine postpartum care, plan for discharge home tomorrow.   LOS: 1 day   Conor Lata M Jakeria Caissie, DO  12/29/2024, 7:12 AM

## 2024-12-29 NOTE — Plan of Care (Signed)
  Problem: Education: Goal: Knowledge of General Education information will improve Description: Including pain rating scale, medication(s)/side effects and non-pharmacologic comfort measures Outcome: Progressing   Problem: Health Behavior/Discharge Planning: Goal: Ability to manage health-related needs will improve Outcome: Progressing   Problem: Clinical Measurements: Goal: Ability to maintain clinical measurements within normal limits will improve Outcome: Progressing Goal: Will remain free from infection Outcome: Progressing Goal: Diagnostic test results will improve Outcome: Progressing Goal: Respiratory complications will improve Outcome: Progressing Goal: Cardiovascular complication will be avoided Outcome: Progressing   Problem: Activity: Goal: Risk for activity intolerance will decrease Outcome: Progressing   Problem: Nutrition: Goal: Adequate nutrition will be maintained Outcome: Progressing   Problem: Coping: Goal: Level of anxiety will decrease Outcome: Progressing   Problem: Elimination: Goal: Will not experience complications related to bowel motility Outcome: Progressing Goal: Will not experience complications related to urinary retention Outcome: Progressing   Problem: Pain Managment: Goal: General experience of comfort will improve and/or be controlled Outcome: Progressing   Problem: Safety: Goal: Ability to remain free from injury will improve Outcome: Progressing   Problem: Skin Integrity: Goal: Risk for impaired skin integrity will decrease Outcome: Progressing   Problem: Education: Goal: Knowledge of Childbirth will improve Outcome: Progressing Goal: Ability to make informed decisions regarding treatment and plan of care will improve Outcome: Progressing Goal: Ability to state and carry out methods to decrease the pain will improve Outcome: Progressing Goal: Individualized Educational Video(s) Outcome: Progressing   Problem:  Coping: Goal: Ability to verbalize concerns and feelings about labor and delivery will improve Outcome: Progressing   Problem: Life Cycle: Goal: Ability to make normal progression through stages of labor will improve Outcome: Progressing Goal: Ability to effectively push during vaginal delivery will improve Outcome: Progressing   Problem: Role Relationship: Goal: Will demonstrate positive interactions with the child Outcome: Progressing   Problem: Safety: Goal: Risk of complications during labor and delivery will decrease Outcome: Progressing   Problem: Pain Management: Goal: Relief or control of pain from uterine contractions will improve Outcome: Progressing   Problem: Education: Goal: Knowledge of condition will improve Outcome: Progressing Goal: Individualized Educational Video(s) Outcome: Progressing Goal: Individualized Newborn Educational Video(s) Outcome: Progressing   Problem: Activity: Goal: Will verbalize the importance of balancing activity with adequate rest periods Outcome: Progressing Goal: Ability to tolerate increased activity will improve Outcome: Progressing   Problem: Coping: Goal: Ability to identify and utilize available resources and services will improve Outcome: Progressing   Problem: Life Cycle: Goal: Chance of risk for complications during the postpartum period will decrease Outcome: Progressing   Problem: Role Relationship: Goal: Ability to demonstrate positive interaction with newborn will improve Outcome: Progressing   Problem: Skin Integrity: Goal: Demonstration of wound healing without infection will improve Outcome: Progressing   Problem: Education: Goal: Knowledge of condition will improve Outcome: Progressing Goal: Individualized Educational Video(s) Outcome: Progressing Goal: Individualized Newborn Educational Video(s) Outcome: Progressing   Problem: Activity: Goal: Will verbalize the importance of balancing activity  with adequate rest periods Outcome: Progressing Goal: Ability to tolerate increased activity will improve Outcome: Progressing   Problem: Coping: Goal: Ability to identify and utilize available resources and services will improve Outcome: Progressing   Problem: Life Cycle: Goal: Chance of risk for complications during the postpartum period will decrease Outcome: Progressing   Problem: Role Relationship: Goal: Ability to demonstrate positive interaction with newborn will improve Outcome: Progressing   Problem: Skin Integrity: Goal: Demonstration of wound healing without infection will improve Outcome: Progressing

## 2024-12-29 NOTE — Lactation Note (Signed)
 This note was copied from a baby's chart. Lactation Consultation Note  Patient Name: Regina Cunningham Unijb'd Date: 12/29/2024 Age:42 hours  Mom chooses to formula feed.   Maternal Data    Feeding    LATCH Score                    Lactation Tools Discussed/Used    Interventions    Discharge    Consult Status Consult Status: Complete    [name] G 12/29/2024, 12:04 AM

## 2024-12-29 NOTE — Anesthesia Postprocedure Evaluation (Signed)
"   Anesthesia Post Note  Patient: Tanijah Morais  Procedure(s) Performed: AN AD HOC LABOR EPIDURAL     Patient location during evaluation: Mother Baby Anesthesia Type: Epidural Level of consciousness: awake and alert Pain management: pain level controlled Vital Signs Assessment: post-procedure vital signs reviewed and stable Respiratory status: spontaneous breathing, nonlabored ventilation and respiratory function stable Cardiovascular status: stable Postop Assessment: no headache, no backache and epidural receding Anesthetic complications: no   No notable events documented.  Last Vitals:  Vitals:   12/29/24 0055 12/29/24 0321  BP: (!) 149/74 (!) 108/95  Pulse: 88 75  Resp: 19 18  Temp: 37.3 C 36.9 C  SpO2: 98% 98%    Last Pain:  Vitals:   12/29/24 0637  TempSrc:   PainSc: 10-Worst pain ever   Pain Goal:                   Chyane Greer      "

## 2024-12-30 ENCOUNTER — Other Ambulatory Visit (HOSPITAL_COMMUNITY): Payer: Self-pay

## 2024-12-30 MED ORDER — OXYCODONE HCL 5 MG PO TABS
5.0000 mg | ORAL_TABLET | Freq: Four times a day (QID) | ORAL | 0 refills | Status: AC | PRN
  Filled 2024-12-30: qty 10, 3d supply, fill #0

## 2024-12-30 MED ORDER — ACETAMINOPHEN 325 MG PO TABS: 650.0000 mg | ORAL_TABLET | Freq: Four times a day (QID) | ORAL | Status: AC | PRN

## 2024-12-30 MED ORDER — IBUPROFEN 600 MG PO TABS
600.0000 mg | ORAL_TABLET | Freq: Four times a day (QID) | ORAL | 0 refills | Status: AC
  Filled 2024-12-30: qty 30, 8d supply, fill #0

## 2024-12-30 MED ORDER — OXYCODONE HCL 5 MG PO TABS
5.0000 mg | ORAL_TABLET | Freq: Four times a day (QID) | ORAL | Status: DC | PRN
  Administered 2024-12-30: 5 mg via ORAL
  Filled 2024-12-30: qty 1

## 2025-01-06 ENCOUNTER — Telehealth (HOSPITAL_COMMUNITY): Payer: Self-pay | Admitting: *Deleted

## 2025-01-06 NOTE — Telephone Encounter (Signed)
 01/06/2025  Name: Regina Cunningham MRN: [id number] DOB: 01/16/1981  Reason for Call:  Transition of Care Hospital Discharge Call  Contact Status: Patient Contact Status: Message  Language assistant needed: Interpreter Mode: Telephonic Interpreter Interpreter Name: Kyra [phone]        Follow-Up Questions:    Van Postnatal Depression Scale:  In the Past 7 Days:    PHQ2-9 Depression Scale:     Discharge Follow-up:    Post-discharge interventions: NA  Mliss Sieve, RN 01/06/2025 14:51
